# Patient Record
Sex: Female | Born: 1970 | Race: White | Hispanic: No | Marital: Married | Smoking: Former smoker
Health system: Southern US, Community
[De-identification: ages and names within clinical notes are randomized; demographics above are authoritative.]

## PROBLEM LIST (undated history)

## (undated) DIAGNOSIS — D6851 Activated protein C resistance: Secondary | ICD-10-CM

## (undated) DIAGNOSIS — K219 Gastro-esophageal reflux disease without esophagitis: Secondary | ICD-10-CM

## (undated) DIAGNOSIS — F329 Major depressive disorder, single episode, unspecified: Secondary | ICD-10-CM

## (undated) DIAGNOSIS — M797 Fibromyalgia: Secondary | ICD-10-CM

## (undated) DIAGNOSIS — I8390 Asymptomatic varicose veins of unspecified lower extremity: Secondary | ICD-10-CM

## (undated) DIAGNOSIS — Z8619 Personal history of other infectious and parasitic diseases: Secondary | ICD-10-CM

## (undated) DIAGNOSIS — G500 Trigeminal neuralgia: Secondary | ICD-10-CM

## (undated) DIAGNOSIS — F32A Depression, unspecified: Secondary | ICD-10-CM

## (undated) DIAGNOSIS — G501 Atypical facial pain: Secondary | ICD-10-CM

## (undated) HISTORY — DX: Activated protein C resistance: D68.51

## (undated) HISTORY — PX: APPENDECTOMY: SHX54

## (undated) HISTORY — DX: Atypical facial pain: G50.1

## (undated) HISTORY — DX: Gastro-esophageal reflux disease without esophagitis: K21.9

## (undated) HISTORY — DX: Asymptomatic varicose veins of unspecified lower extremity: I83.90

## (undated) HISTORY — DX: Fibromyalgia: M79.7

## (undated) HISTORY — DX: Major depressive disorder, single episode, unspecified: F32.9

## (undated) HISTORY — DX: Personal history of other infectious and parasitic diseases: Z86.19

## (undated) HISTORY — DX: Depression, unspecified: F32.A

---
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
## 1999-02-26 DIAGNOSIS — G501 Atypical facial pain: Secondary | ICD-10-CM

## 1999-02-26 HISTORY — DX: Atypical facial pain: G50.1

## 2005-04-25 ENCOUNTER — Encounter: Payer: Self-pay | Admitting: Family Medicine

## 2005-04-26 ENCOUNTER — Encounter: Payer: Self-pay | Admitting: Family Medicine

## 2005-04-26 LAB — CONVERTED CEMR LAB: TSH: 1.89 microintl units/mL

## 2006-02-03 ENCOUNTER — Telehealth (INDEPENDENT_AMBULATORY_CARE_PROVIDER_SITE_OTHER): Payer: Self-pay | Admitting: *Deleted

## 2006-02-19 ENCOUNTER — Ambulatory Visit: Payer: Self-pay | Admitting: Family Medicine

## 2006-02-19 DIAGNOSIS — K625 Hemorrhage of anus and rectum: Secondary | ICD-10-CM

## 2006-02-20 ENCOUNTER — Encounter: Payer: Self-pay | Admitting: Family Medicine

## 2006-03-18 ENCOUNTER — Other Ambulatory Visit: Admission: RE | Admit: 2006-03-18 | Discharge: 2006-03-18 | Payer: Self-pay | Admitting: Family Medicine

## 2006-03-18 ENCOUNTER — Ambulatory Visit: Payer: Self-pay | Admitting: Family Medicine

## 2006-03-18 ENCOUNTER — Encounter: Payer: Self-pay | Admitting: Family Medicine

## 2006-03-18 DIAGNOSIS — R920 Mammographic microcalcification found on diagnostic imaging of breast: Secondary | ICD-10-CM

## 2006-04-01 ENCOUNTER — Encounter: Payer: Self-pay | Admitting: Family Medicine

## 2006-06-18 ENCOUNTER — Ambulatory Visit: Payer: Self-pay | Admitting: Family Medicine

## 2006-06-19 ENCOUNTER — Telehealth (INDEPENDENT_AMBULATORY_CARE_PROVIDER_SITE_OTHER): Payer: Self-pay | Admitting: *Deleted

## 2006-06-19 ENCOUNTER — Encounter: Payer: Self-pay | Admitting: Family Medicine

## 2006-06-19 LAB — CONVERTED CEMR LAB
HCT: 41.7 % (ref 36.0–46.0)
Hemoglobin: 13.6 g/dL (ref 12.0–15.0)
MCV: 92.9 fL (ref 78.0–100.0)
RBC: 4.49 M/uL (ref 3.87–5.11)
WBC: 6.5 10*3/uL (ref 4.0–10.5)

## 2006-06-24 ENCOUNTER — Telehealth (INDEPENDENT_AMBULATORY_CARE_PROVIDER_SITE_OTHER): Payer: Self-pay | Admitting: *Deleted

## 2006-06-24 ENCOUNTER — Ambulatory Visit (HOSPITAL_COMMUNITY): Admission: RE | Admit: 2006-06-24 | Discharge: 2006-06-24 | Payer: Self-pay | Admitting: Family Medicine

## 2006-07-09 ENCOUNTER — Ambulatory Visit: Payer: Self-pay | Admitting: Family Medicine

## 2006-07-09 DIAGNOSIS — N943 Premenstrual tension syndrome: Secondary | ICD-10-CM | POA: Insufficient documentation

## 2006-08-26 ENCOUNTER — Ambulatory Visit: Payer: Self-pay | Admitting: Family Medicine

## 2006-08-26 LAB — CONVERTED CEMR LAB
Bilirubin Urine: NEGATIVE
Glucose, Urine, Semiquant: NEGATIVE
Ketones, urine, test strip: NEGATIVE
Nitrite: POSITIVE
Protein, U semiquant: NEGATIVE
Specific Gravity, Urine: 1.025
Urobilinogen, UA: NEGATIVE
pH: 5.5

## 2007-01-06 ENCOUNTER — Encounter: Admission: RE | Admit: 2007-01-06 | Discharge: 2007-01-06 | Payer: Self-pay | Admitting: Family Medicine

## 2007-01-06 ENCOUNTER — Ambulatory Visit: Payer: Self-pay | Admitting: Family Medicine

## 2007-01-06 LAB — CONVERTED CEMR LAB
Albumin: 4.5 g/dL (ref 3.5–5.2)
Alkaline Phosphatase: 29 units/L — ABNORMAL LOW (ref 39–117)
BUN: 13 mg/dL (ref 6–23)
Bilirubin Urine: NEGATIVE
CO2: 22 meq/L (ref 19–32)
Eosinophils Absolute: 0.1 10*3/uL — ABNORMAL LOW (ref 0.2–0.7)
Eosinophils Relative: 2 % (ref 0–5)
Glucose, Bld: 119 mg/dL — ABNORMAL HIGH (ref 70–99)
HCT: 44.2 % (ref 36.0–46.0)
Ketones, urine, test strip: NEGATIVE
Lipase: 33 units/L (ref 0–75)
Lymphocytes Relative: 32 % (ref 12–46)
Lymphs Abs: 2.1 10*3/uL (ref 0.7–4.0)
MCV: 90 fL (ref 78.0–100.0)
Monocytes Relative: 7 % (ref 3–12)
Neutrophils Relative %: 60 % (ref 43–77)
Potassium: 4 meq/L (ref 3.5–5.3)
Protein, U semiquant: NEGATIVE
RBC: 4.91 M/uL (ref 3.87–5.11)
Total Protein: 7.2 g/dL (ref 6.0–8.3)
Urobilinogen, UA: NEGATIVE
WBC: 6.5 10*3/uL (ref 4.0–10.5)
pH: 6

## 2007-01-07 ENCOUNTER — Encounter: Payer: Self-pay | Admitting: Family Medicine

## 2007-01-09 ENCOUNTER — Encounter: Admission: RE | Admit: 2007-01-09 | Discharge: 2007-01-09 | Payer: Self-pay | Admitting: Family Medicine

## 2007-01-29 ENCOUNTER — Ambulatory Visit: Payer: Self-pay | Admitting: Family Medicine

## 2007-03-30 ENCOUNTER — Encounter: Admission: RE | Admit: 2007-03-30 | Discharge: 2007-03-30 | Payer: Self-pay | Admitting: Family Medicine

## 2007-06-26 ENCOUNTER — Encounter: Payer: Self-pay | Admitting: Family Medicine

## 2007-07-07 ENCOUNTER — Ambulatory Visit: Payer: Self-pay | Admitting: Family Medicine

## 2007-07-07 DIAGNOSIS — G501 Atypical facial pain: Secondary | ICD-10-CM | POA: Insufficient documentation

## 2007-11-11 ENCOUNTER — Encounter: Payer: Self-pay | Admitting: Family Medicine

## 2007-12-11 ENCOUNTER — Ambulatory Visit: Payer: Self-pay | Admitting: Family Medicine

## 2007-12-14 LAB — CONVERTED CEMR LAB: HCV Ab: NEGATIVE

## 2007-12-15 ENCOUNTER — Telehealth: Payer: Self-pay | Admitting: Family Medicine

## 2007-12-30 ENCOUNTER — Ambulatory Visit: Payer: Self-pay | Admitting: Family Medicine

## 2007-12-30 ENCOUNTER — Encounter: Admission: RE | Admit: 2007-12-30 | Discharge: 2007-12-30 | Payer: Self-pay | Admitting: Family Medicine

## 2007-12-30 ENCOUNTER — Other Ambulatory Visit: Admission: RE | Admit: 2007-12-30 | Discharge: 2007-12-30 | Payer: Self-pay | Admitting: Family Medicine

## 2007-12-30 ENCOUNTER — Encounter: Payer: Self-pay | Admitting: Family Medicine

## 2007-12-30 DIAGNOSIS — R109 Unspecified abdominal pain: Secondary | ICD-10-CM

## 2007-12-30 DIAGNOSIS — N949 Unspecified condition associated with female genital organs and menstrual cycle: Secondary | ICD-10-CM | POA: Insufficient documentation

## 2007-12-30 LAB — CONVERTED CEMR LAB
ALT: 9 units/L (ref 0–35)
AST: 13 units/L (ref 0–37)
Albumin: 4.5 g/dL (ref 3.5–5.2)
Alkaline Phosphatase: 31 units/L — ABNORMAL LOW (ref 39–117)
Calcium: 9.7 mg/dL (ref 8.4–10.5)
Chloride: 108 meq/L (ref 96–112)
Glucose, Urine, Semiquant: NEGATIVE
HDL: 65 mg/dL (ref >39)
Hemoglobin: 14 g/dL (ref 12.0–15.0)
LDL Cholesterol: 132 mg/dL — ABNORMAL HIGH (ref 0–99)
MCHC: 33.5 g/dL (ref 30.0–36.0)
Nitrite: NEGATIVE
Platelets: 211 10*3/uL (ref 150–400)
Potassium: 4.3 meq/L (ref 3.5–5.3)
RDW: 13.4 % (ref 11.5–15.5)
Sodium: 141 meq/L (ref 135–145)
Specific Gravity, Urine: 1.02
TSH: 1.731 microintl units/mL (ref 0.350–4.50)
WBC Urine, dipstick: NEGATIVE
pH: 5

## 2007-12-31 ENCOUNTER — Encounter: Payer: Self-pay | Admitting: Family Medicine

## 2008-01-06 ENCOUNTER — Ambulatory Visit: Payer: Self-pay | Admitting: Obstetrics & Gynecology

## 2008-01-14 ENCOUNTER — Ambulatory Visit: Payer: Self-pay | Admitting: Obstetrics & Gynecology

## 2008-01-14 ENCOUNTER — Ambulatory Visit (HOSPITAL_COMMUNITY): Admission: RE | Admit: 2008-01-14 | Discharge: 2008-01-14 | Payer: Self-pay | Admitting: Obstetrics & Gynecology

## 2008-01-14 HISTORY — PX: HYSTEROSCOPY: SHX211

## 2008-01-14 HISTORY — PX: ENDOMETRIAL ABLATION: SHX621

## 2008-03-09 ENCOUNTER — Ambulatory Visit: Payer: Self-pay | Admitting: Obstetrics & Gynecology

## 2008-04-06 ENCOUNTER — Ambulatory Visit: Payer: Self-pay | Admitting: Obstetrics & Gynecology

## 2008-05-03 ENCOUNTER — Encounter: Admission: RE | Admit: 2008-05-03 | Discharge: 2008-05-03 | Payer: Self-pay | Admitting: Family Medicine

## 2008-06-22 ENCOUNTER — Ambulatory Visit: Payer: Self-pay | Admitting: Obstetrics & Gynecology

## 2008-08-18 ENCOUNTER — Ambulatory Visit: Payer: Self-pay | Admitting: Obstetrics & Gynecology

## 2008-09-14 ENCOUNTER — Ambulatory Visit: Payer: Self-pay | Admitting: Obstetrics & Gynecology

## 2008-09-15 ENCOUNTER — Encounter: Payer: Self-pay | Admitting: Obstetrics & Gynecology

## 2008-09-15 LAB — CONVERTED CEMR LAB: TSH: 1.993 microintl units/mL (ref 0.350–4.500)

## 2008-10-15 ENCOUNTER — Ambulatory Visit: Payer: Self-pay | Admitting: Family Medicine

## 2008-10-15 DIAGNOSIS — J0100 Acute maxillary sinusitis, unspecified: Secondary | ICD-10-CM

## 2008-11-16 ENCOUNTER — Ambulatory Visit: Payer: Self-pay | Admitting: Obstetrics & Gynecology

## 2009-01-24 ENCOUNTER — Encounter: Payer: Self-pay | Admitting: Family Medicine

## 2009-03-08 ENCOUNTER — Ambulatory Visit: Payer: Self-pay | Admitting: Obstetrics & Gynecology

## 2009-03-08 LAB — CONVERTED CEMR LAB
Hemoglobin: 14.2 g/dL (ref 12.0–15.0)
MCHC: 32.9 g/dL (ref 30.0–36.0)
RBC: 4.73 M/uL (ref 3.87–5.11)
RDW: 13.9 % (ref 11.5–15.5)

## 2009-03-22 ENCOUNTER — Ambulatory Visit: Payer: Self-pay | Admitting: Obstetrics & Gynecology

## 2009-03-27 ENCOUNTER — Encounter: Admission: RE | Admit: 2009-03-27 | Discharge: 2009-03-27 | Payer: Self-pay | Admitting: Obstetrics & Gynecology

## 2009-03-28 ENCOUNTER — Ambulatory Visit: Payer: Self-pay | Admitting: Obstetrics & Gynecology

## 2009-03-29 ENCOUNTER — Encounter: Payer: Self-pay | Admitting: Obstetrics & Gynecology

## 2009-03-29 LAB — CONVERTED CEMR LAB: CA 125: 14.4 units/mL (ref 0.0–30.2)

## 2009-05-03 ENCOUNTER — Encounter: Admission: RE | Admit: 2009-05-03 | Discharge: 2009-05-03 | Payer: Self-pay | Admitting: Obstetrics & Gynecology

## 2009-05-10 ENCOUNTER — Encounter: Payer: Self-pay | Admitting: Obstetrics & Gynecology

## 2009-05-10 ENCOUNTER — Ambulatory Visit (HOSPITAL_COMMUNITY): Admission: RE | Admit: 2009-05-10 | Discharge: 2009-05-11 | Payer: Self-pay | Admitting: Obstetrics & Gynecology

## 2009-05-10 ENCOUNTER — Ambulatory Visit: Payer: Self-pay | Admitting: Obstetrics & Gynecology

## 2009-05-10 HISTORY — PX: TOTAL ABDOMINAL HYSTERECTOMY: SHX209

## 2009-05-30 ENCOUNTER — Ambulatory Visit: Payer: Self-pay | Admitting: Family Medicine

## 2009-05-30 DIAGNOSIS — R599 Enlarged lymph nodes, unspecified: Secondary | ICD-10-CM | POA: Insufficient documentation

## 2009-05-30 LAB — CONVERTED CEMR LAB
Basophils Absolute: 0 10*3/uL (ref 0.0–0.1)
HCT: 37.7 % (ref 36.0–46.0)
Hemoglobin: 12.9 g/dL (ref 12.0–15.0)
Lymphocytes Relative: 30 % (ref 12–46)
Lymphs Abs: 1.5 10*3/uL (ref 0.7–4.0)
Neutro Abs: 3.1 10*3/uL (ref 1.7–7.7)
Platelets: 210 10*3/uL (ref 150–400)
RDW: 12.9 % (ref 11.5–15.5)
Rapid Strep: NEGATIVE
WBC: 4.9 10*3/uL (ref 4.0–10.5)

## 2009-05-31 ENCOUNTER — Encounter: Payer: Self-pay | Admitting: Family Medicine

## 2009-06-21 ENCOUNTER — Ambulatory Visit: Payer: Self-pay | Admitting: Obstetrics & Gynecology

## 2009-06-27 ENCOUNTER — Encounter: Admission: RE | Admit: 2009-06-27 | Discharge: 2009-06-27 | Payer: Self-pay | Admitting: Obstetrics & Gynecology

## 2009-06-27 ENCOUNTER — Ambulatory Visit: Payer: Self-pay | Admitting: Obstetrics & Gynecology

## 2009-07-19 ENCOUNTER — Ambulatory Visit: Payer: Self-pay | Admitting: Family Medicine

## 2009-07-19 LAB — CONVERTED CEMR LAB
Glucose, Urine, Semiquant: NEGATIVE
Nitrite: NEGATIVE
Protein, U semiquant: NEGATIVE
Urobilinogen, UA: 0.2

## 2009-07-20 ENCOUNTER — Encounter: Payer: Self-pay | Admitting: Family Medicine

## 2009-07-22 ENCOUNTER — Emergency Department (HOSPITAL_COMMUNITY): Admission: EM | Admit: 2009-07-22 | Discharge: 2009-07-22 | Payer: Self-pay | Admitting: Family Medicine

## 2009-07-22 ENCOUNTER — Emergency Department (HOSPITAL_COMMUNITY): Admission: EM | Admit: 2009-07-22 | Discharge: 2009-07-22 | Payer: Self-pay | Admitting: Emergency Medicine

## 2009-08-03 ENCOUNTER — Encounter: Payer: Self-pay | Admitting: Family Medicine

## 2009-08-30 ENCOUNTER — Encounter: Payer: Self-pay | Admitting: Obstetrics & Gynecology

## 2009-09-20 ENCOUNTER — Ambulatory Visit: Payer: Self-pay | Admitting: Obstetrics & Gynecology

## 2009-09-21 ENCOUNTER — Encounter: Payer: Self-pay | Admitting: Obstetrics & Gynecology

## 2009-09-21 LAB — CONVERTED CEMR LAB
Hemoglobin, Urine: NEGATIVE
Leukocytes, UA: NEGATIVE
Nitrite: NEGATIVE
Protein, ur: NEGATIVE mg/dL
Urobilinogen, UA: 0.2 (ref 0.0–1.0)
WBC, Wet Prep HPF POC: NONE SEEN

## 2009-09-26 ENCOUNTER — Encounter: Payer: Self-pay | Admitting: Obstetrics & Gynecology

## 2009-09-27 ENCOUNTER — Ambulatory Visit: Payer: Self-pay | Admitting: Obstetrics & Gynecology

## 2009-10-26 ENCOUNTER — Encounter: Payer: Self-pay | Admitting: Family Medicine

## 2009-11-01 ENCOUNTER — Ambulatory Visit: Payer: Self-pay | Admitting: Obstetrics & Gynecology

## 2009-11-02 ENCOUNTER — Encounter: Payer: Self-pay | Admitting: Obstetrics & Gynecology

## 2009-11-02 LAB — CONVERTED CEMR LAB
Clue Cells Wet Prep HPF POC: NONE SEEN
WBC, Wet Prep HPF POC: NONE SEEN

## 2009-11-23 ENCOUNTER — Ambulatory Visit: Payer: Self-pay | Admitting: Family Medicine

## 2009-11-23 DIAGNOSIS — N390 Urinary tract infection, site not specified: Secondary | ICD-10-CM

## 2009-11-23 LAB — CONVERTED CEMR LAB
Bilirubin Urine: NEGATIVE
Glucose, Urine, Semiquant: NEGATIVE
Ketones, urine, test strip: NEGATIVE
Specific Gravity, Urine: 1.02

## 2009-11-24 ENCOUNTER — Encounter: Payer: Self-pay | Admitting: Family Medicine

## 2009-11-28 ENCOUNTER — Encounter: Admission: RE | Admit: 2009-11-28 | Discharge: 2009-11-28 | Payer: Self-pay | Admitting: Family Medicine

## 2009-11-28 ENCOUNTER — Telehealth: Payer: Self-pay | Admitting: Family Medicine

## 2009-11-28 DIAGNOSIS — M545 Low back pain: Secondary | ICD-10-CM

## 2009-11-29 ENCOUNTER — Encounter: Payer: Self-pay | Admitting: Family Medicine

## 2009-11-29 ENCOUNTER — Ambulatory Visit: Payer: Self-pay | Admitting: Family Medicine

## 2010-03-02 ENCOUNTER — Encounter: Payer: Self-pay | Admitting: Family Medicine

## 2010-03-02 ENCOUNTER — Ambulatory Visit
Admission: RE | Admit: 2010-03-02 | Discharge: 2010-03-02 | Payer: Self-pay | Source: Home / Self Care | Attending: Family Medicine | Admitting: Family Medicine

## 2010-03-02 IMAGING — US US TRANSVAGINAL NON-OB
1 series · 13 of 25 positions shown · non-contrast
Comparison: The ultrasound 06/24/2006

CLINICAL DATA: 37-year-old female with menorrhagia

TRANSABDOMINAL AND TRANSVAGINAL ULTRASOUND OF PELVIS
TECHNIQUE: Both transabdominal and transvaginal ultrasound
examinations of the pelvis were performed including evaluation of
the uterus, ovaries, adnexal regions, and pelvic cul-de-sac.

[Series 1: us transvaginal non-ob · 0.30mm/px · 13 of 63 slices shown]
[im 1/63]
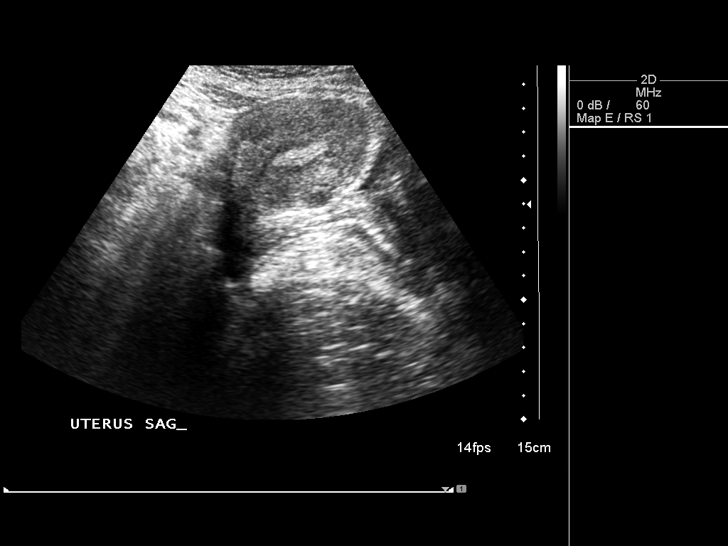
[im 6/63]
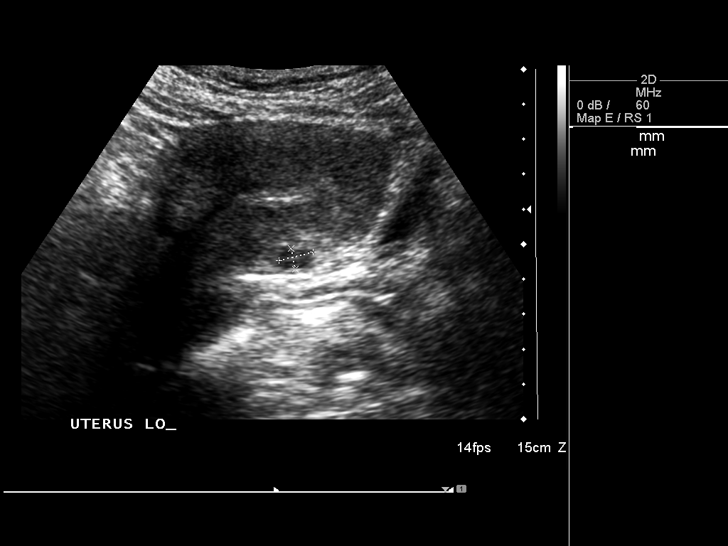
[im 11/63]
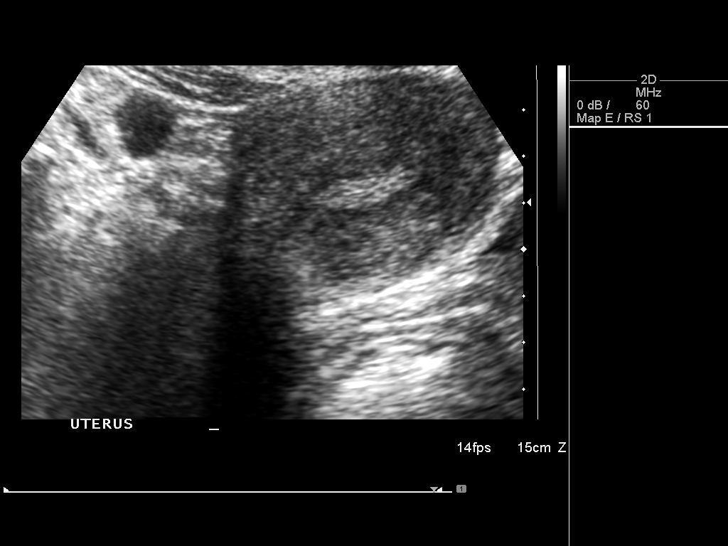
[im 16/63]
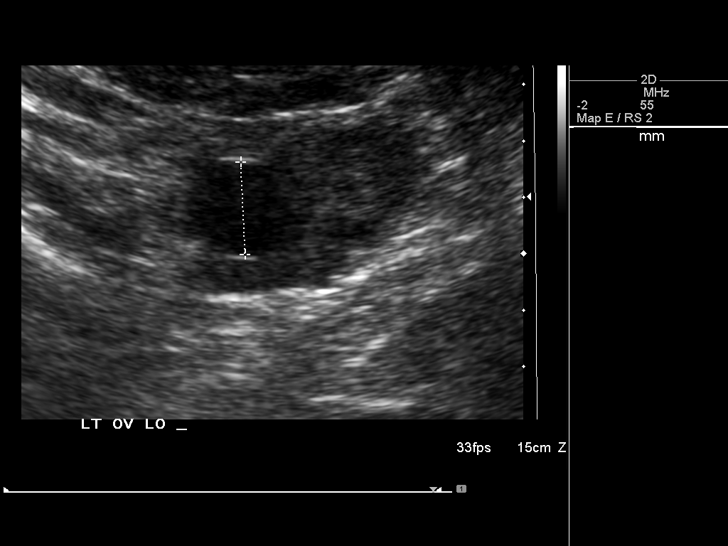
[im 21/63]
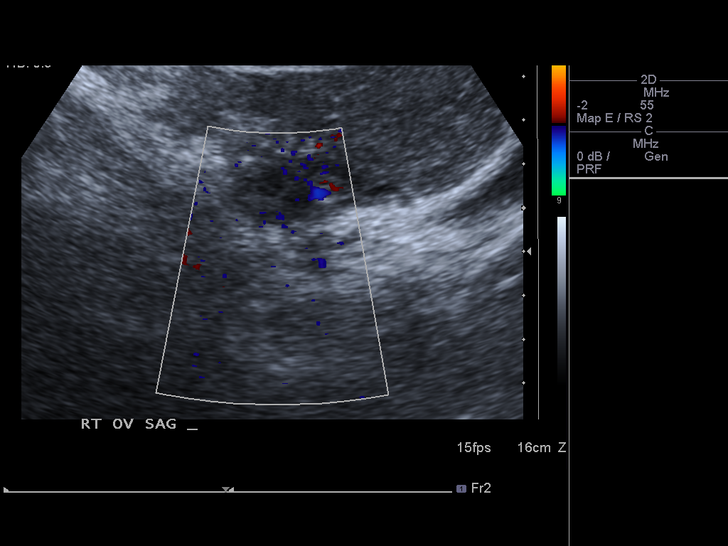
[im 26/63]
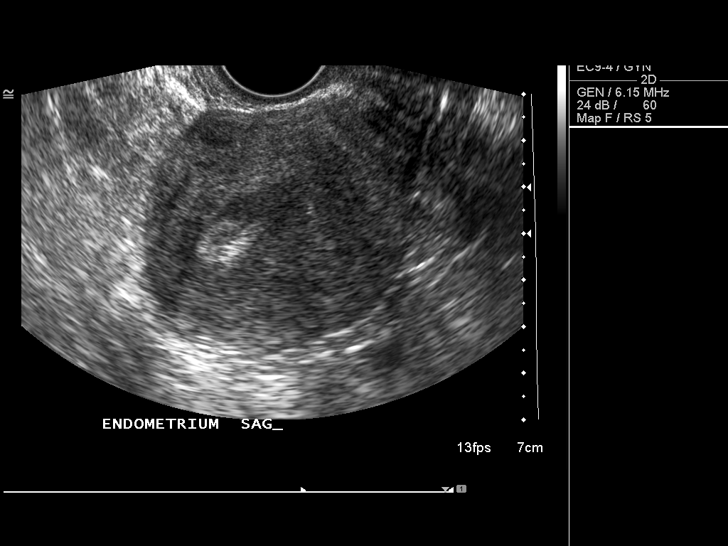
[im 32/63]
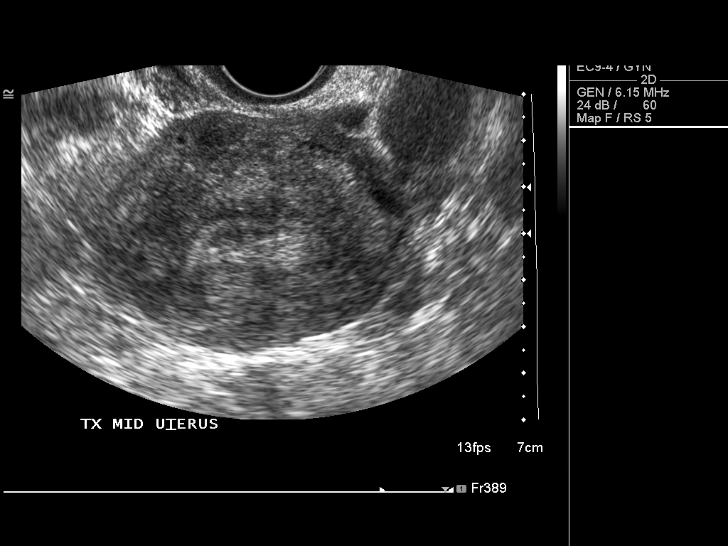
[im 37/63]
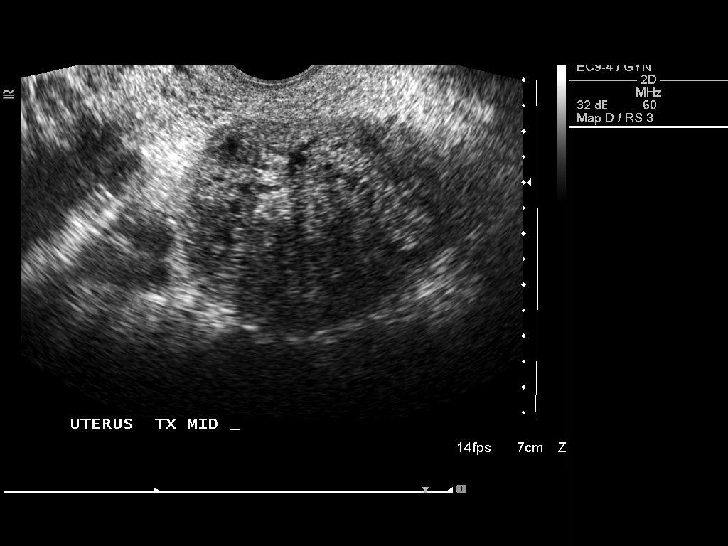
[im 42/63]
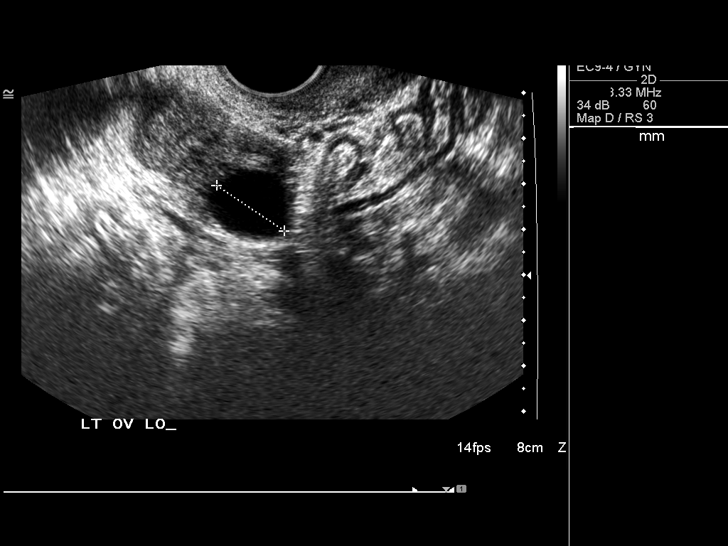
[im 47/63]
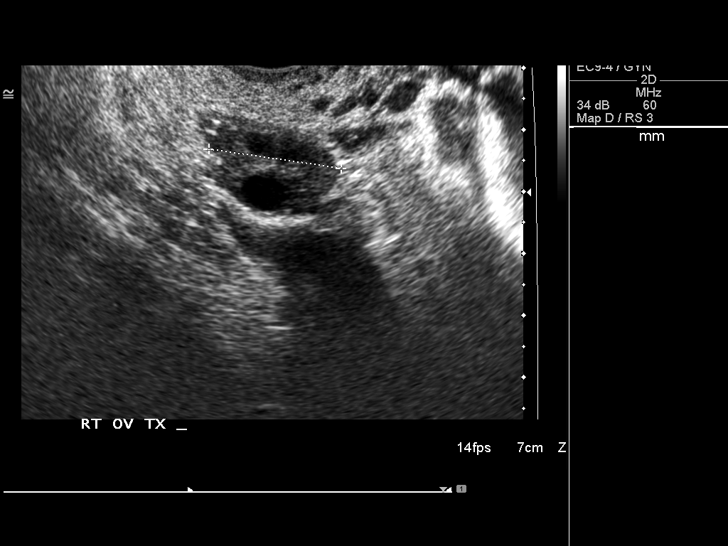
[im 52/63]
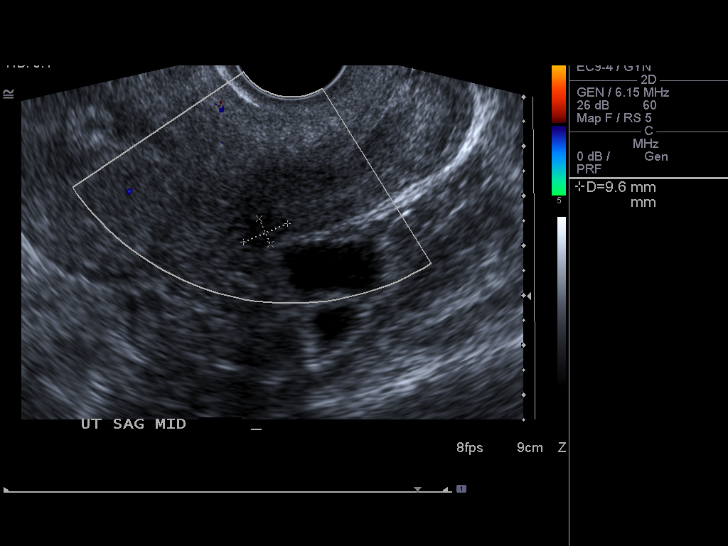
[im 57/63]
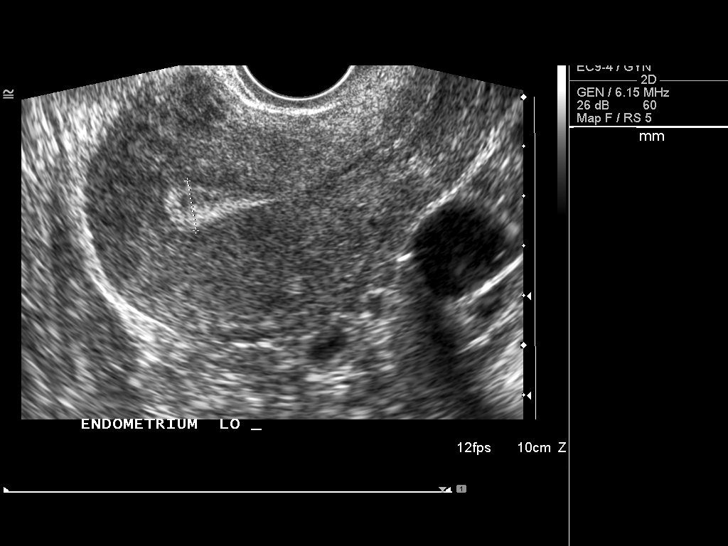
[im 63/63]
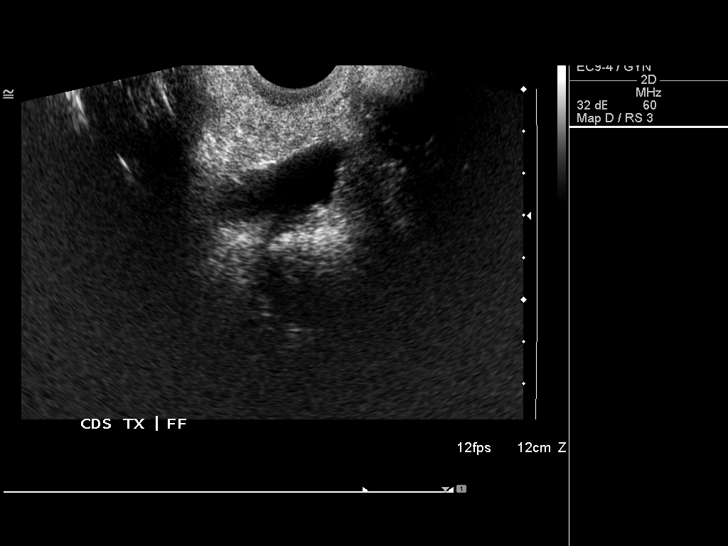

[13 of 25 positions shown; findings below may reference images not displayed]

FINDINGS: There the uterus is mildly heterogeneous in echo texture
and normal in size measuring 10.1 x 5.1 x 6.4 cm.  There are two
small round intramural lesions consistent with leiomyomas.  One in
the anterior uterine fundus measuring 1.1 cm and a second in the
posterior fundus measuring 1 cm.  There is a small 6 mm cystic
lesion in the lower uterine segment likely representing a high
nabothian cyst or cystic leiomyoma..

The endometrium is 11 mm in thickness which is normal for pre
menopausal female.

The right ovary measures 3.1 x 1.4 x 3.2 cm and is normal in
appearance.

The left ovary measures 4.4 x 2.1 x 2.4 cm and contains a 17 mm
dominant follicle.

Small volume of free fluid in the posterior cul-de-sac is likely
physiologic.
IMPRESSION: 1..  Small leiomyomas within the uterus.
2.  Normal endometrium for premenopausal female.
3.  Normal ovaries.

## 2010-03-14 ENCOUNTER — Ambulatory Visit (HOSPITAL_COMMUNITY)
Admission: RE | Admit: 2010-03-14 | Discharge: 2010-03-14 | Payer: Self-pay | Source: Home / Self Care | Attending: Psychology | Admitting: Psychology

## 2010-03-18 ENCOUNTER — Encounter: Payer: Self-pay | Admitting: Obstetrics & Gynecology

## 2010-03-27 NOTE — Letter (Signed)
Summary: [facility]  [facility]   Imported By: Lanelle Bal 12/08/2009 10:26:41  _____________________________________________________________________  External Attachment:    Type:   Image     Comment:   External Document

## 2010-03-27 NOTE — Assessment & Plan Note (Signed)
Summary: abdominal & Back pain,   Vital Signs:  Patient profile:   40 year old female Height:      66.5 inches Weight:      157 pounds Pulse rate:   119 / minute BP sitting:   117 / 76  (right arm) Cuff size:   regular  Vitals Entered By: Avon Gully CMA, Duncan Dull) (November 23, 2009 1:08 PM) CC: back pain and abd pain , has had 5 uti since hysterctomy   Primary Care Provider:  Seymour Bars D.O.  CC:  back pain and abd pain  and has had 5 uti since hysterctomy.  History of Present Illness: back pain and abd pain , has had 5 uti since hysterctomy. Still has her ovaries.  Dull pain on her right flank and occ radiates around to the RUQ. Did lab and a GB scan recently that were normal.  Says she knows she doesn't drink enough fluid during the da.  Had a bladder tack during her hyterectomy. Just had sxs for a few days.  No fever. No nausea or vomiting. No trauma to her back.  Current Medications (verified): 1)  Amoxicillin 500 Mg Tabs (Amoxicillin) .... Took For 2 Days Had Left Over From Previous Infection 2)  Valacyclovir Hcl 500 Mg Tabs (Valacyclovir Hcl) 3)  Pyridium 200 Mg Tabs (Phenazopyridine Hcl) .... Sig 1 By Mouth 3x A Day As Needed For Symptoms 4)  Bactrim Ds 800-160 Mg Tabs (Sulfamethoxazole-Trimethoprim) .... Take 1 Tablet By Mouth Two Times A Day For 7 Days 5)  Baclofen 10 Mg Tabs (Baclofen) .... One Tablet By Mouth Once A Day 6)  Oxycontin 10 Mg Xr12h-Tab (Oxycodone Hcl) .Marland Kitchen.. 1-2 Tablet By Mouth As Needed Every Six Hours For Pain  Allergies (verified): 1)  ! Hydrocodone  Comments:  Nurse/Medical Assistant: The patient's medications and allergies were reviewed with the patient and were updated in the Medication and Allergy Lists. Avon Gully CMA, Duncan Dull) (November 23, 2009 1:24 PM)  Physical Exam  General:  Well-developed,well-nourished,in no acute distress; alert,appropriate and cooperative throughout examination Abdomen:  soft, normal bowel sounds, no  distention, no hepatomegaly, and no splenomegaly.  Mildly tender in the rUQ and right flank below the ribs.  Msk:  +CVA tenderness on the right.    Impression & Recommendations:  Problem # 1:  UTI (ICD-599.0) UA is + will tx and send for cx.  Likely source of her pain since recentl labs and GB scan were normal. If cx neg then rec further evaluation.  Says she has been on ABX for years and is usually very resistant so is requesting a longer course of ABX.  Her updated medication list for this problem includes:    Amoxicillin 500 Mg Tabs (Amoxicillin) .Marland Kitchen... Took for 2 days had left over from previous infection    Bactrim Ds 800-160 Mg Tabs (Sulfamethoxazole-trimethoprim) .Marland Kitchen... Take 1 tablet by mouth two times a day for 7 days  Orders: UA Dipstick w/o Micro (automated)  (81003) T-Urine Culture (Spectrum Order) [id number])  Complete Medication List: 1)  Amoxicillin 500 Mg Tabs (Amoxicillin) .... Took for 2 days had left over from previous infection 2)  Valacyclovir Hcl 500 Mg Tabs (Valacyclovir hcl) 3)  Pyridium 200 Mg Tabs (phenazopyridine Hcl)  .... Sig 1 by mouth 3x a day as needed for symptoms 4)  Bactrim Ds 800-160 Mg Tabs (Sulfamethoxazole-trimethoprim) .... Take 1 tablet by mouth two times a day for 7 days 5)  Baclofen 10 Mg Tabs (Baclofen) .... One tablet by  mouth once a day 6)  Oxycontin 10 Mg Xr12h-tab (oxycodone Hcl)  .Marland Kitchen.. 1-2 tablet by mouth as needed every six hours for pain  Patient Instructions: 1)  Complete the antibiotic and we will call with the cultue results.  Prescriptions: BACTRIM DS 800-160 MG TABS (SULFAMETHOXAZOLE-TRIMETHOPRIM) Take 1 tablet by mouth two times a day for 7 days  #14 x 0   Entered and Authorized by:   Nani Gasser MD   Signed by:   Nani Gasser MD on 11/23/2009   Method used:   Electronically to        [facility] [id number]       [address]       [place], [place]  [address]       Ph: [phone]       Fax:  [phone]   RxID:   [id number]   Laboratory Results   Urine Tests  Date/Time Received: 11/23/09 Date/Time Reported: 11/23/09  Routine Urinalysis   Color: yellow Appearance: Clear Glucose: negative   (Normal Range: Negative) Bilirubin: negative   (Normal Range: Negative) Ketone: negative   (Normal Range: Negative) Spec. Gravity: 1.020   (Normal Range: 1.003-1.035) Blood: small   (Normal Range: Negative) pH: 5.5   (Normal Range: 5.0-8.0) Protein: negative   (Normal Range: Negative) Urobilinogen: 0.2   (Normal Range: 0-1) Nitrite: negative   (Normal Range: Negative) Leukocyte Esterace: trace   (Normal Range: Negative)

## 2010-03-27 NOTE — Progress Notes (Signed)
Summary: Continued with back pain  Phone Note Call from Patient   Caller: Patient Call For: Seymour Bars DO Summary of Call: Pt is still having back pain has 2 days left of med and didn't know if needed different med or not. States doesn't feel well and back bad really hurts. Initial call taken by: Kathlene November LPN,  November 28, 2009 12:51 PM  Follow-up for Phone Call        Can she do a clean catch UA today and a L spine XRay downstairs today then come in to see me for OV tomorrow? Follow-up by: Seymour Bars DO,  November 28, 2009 1:05 PM  Additional Follow-up for Phone Call Additional follow up Details #1::        Pt notified.Need xray order Additional Follow-up by: Kathlene November LPN,  November 28, 2009 1:09 PM  New Problems: LUMBAGO (ICD-724.2)   Additional Follow-up for Phone Call Additional follow up Details #2::    done. Follow-up by: Seymour Bars DO,  November 28, 2009 1:11 PM  New Problems: LUMBAGO (ICD-724.2)

## 2010-03-27 NOTE — Assessment & Plan Note (Signed)
Summary: POSSIBLE UTI/BLADDER INFECTION   Vital Signs:  Patient Profile:   40 Years Old Female CC:      Dysuria, diarrhea x 4 days Height:     66.5 inches Weight:      150 pounds O2 Sat:      98 % O2 treatment:    Room Air Temp:     98.3 degrees F oral Pulse rate:   91 / minute Pulse rhythm:   regular Resp:     12 per minute BP sitting:   123 / 81  (right arm) Cuff size:   regular  Vitals Entered By: Emilio Math (Jul 19, 2009 9:36 AM)                  Prior Medication List:  OXYCODONE HCL 15 MG TABS (OXYCODONE HCL) four times a day VALTREX 1 GM TABS (VALACYCLOVIR HCL) One by mouth q8h   Current Allergies (reviewed today): ! HYDROCODONEHistory of Present Illness Referral source: Patient  Chief Complaint: Dysuria, diarrhea x 4 days History of Present Illness: Patient report having urinary symptoms about 3 weeks ago> she took Amoxicillin for 3 days. She did get better but then 4 days ago she started having symptoms. she has had a bladder tack in the past.  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  Eyes       Denies  change in vision, eye pain, eye discharge, glasses, contact lenses, and eye surgery. Ear/Nose/Throat/Mouth       Denies hearing loss/aids, change in hearing, ear pain, ear discharge, dizziness, frequent runny nose, frequent nose bleeds, sinus problems, sore throat, hoarseness, and tooth pain or bleeding.  Respiratory       Denies dry cough, productive cough, wheezing, shortness of breath, asthma, bronchitis, and emphysema/COPD.  Cardiovascular       Denies murmurs, chest pain, and tires easily with exhertion.    Gastrointestinal       Complains of stomach pain and diarrhea.      Denies nausea/vomiting, constipation, blood in bowel movements, and indigestion. Genitourniary       Complains of painful urination.      Denies kidney stones and loss of urinary control.      Comments: bladder tack Neurological       Denies paralysis, seizures, and fainting/blackouts. Musculoskeletal       Denies muscle pain, joint pain, joint stiffness, decreased range of motion, redness, swelling, muscle weakness, and gout.  Skin       Denies bruising, unusual mles/lumps or sores, and hair/skin or nail changes.  Psych       Denies mood changes, temper/anger issues, anxiety/stress, speech problems, depression, and sleep problems.  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    Has 2 kids.   Married   Moved from [place] 2006.    Quit smoking in 2001.  Runs  4 days a wk. Alcohol use-no Drug use-no  Risk Factors: Exercise: yes (03/18/2006)  Risk Factors: Smoking Status: quit (02/19/2006)  Past Medical History: Reviewed history from 07/09/2006 and no changes required. trigeminal neuralgia (Dr Jordan Likes) Factor V Leiden deficiency TRAIT  Past Surgical History: Hysterectomy 04/2009 Bladder lift March 2011  Family History: Reviewed  history from 12/30/2007 and no changes required. mother diverticulosis father healthy brother IBD grandmother CVA's  Social History: Reviewed history from 05/30/2009 and no changes required. 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    Has 2 kids.   Married   Moved from [place] 2006.    Quit smoking in 2001.  Runs  4 days a wk. Alcohol use-no Drug use-no Physical Exam General appearance: well developed, well nourished, mild  distress Head: normocephalic, atraumatic Extremities: normal extremities Back: no CVA tendernesss   mild bladder tender  Skin: no obvious rashes or lesions MSE: oriented to time, place, and person Assessment New Problems: URINARY FREQUENCY (ICD-788.41) URINARY TRACT INFECTION (ICD-599.0)  UTI   Patient Education: Patient and/or caregiver instructed in the following: rest fluids and Tylenol.  Plan New Medications/Changes: PYRIDIUM 200 MG TABS (PHENAZOPYRIDINE HCL) Sig 1 by mouth 3x a day as needed for symptoms  #15 x 0, 07/19/2009, Hassan Rowan MD CIPRO 500 MG TABS (CIPROFLOXACIN HCL) 1 by mouth 2 times daily  #10 x 0, 07/19/2009, Hassan Rowan MD  New Orders: T-Culture, Urine [[id number]] Est. Patient Level III [44010] UA Dipstick W/ Micro (manual) [81000] Planning Comments:   see below  Follow Up: Follow up in 2-3 days if no improvement, Follow up on an as needed basis, Follow up with Primary Physician  The patient and/or caregiver has been counseled thoroughly with regard to medications prescribed including dosage, schedule, interactions, rationale for use, and possible side effects and they verbalize understanding.  Diagnoses and expected course of recovery discussed and will return if not improved as expected or if the condition worsens. Patient and/or caregiver verbalized understanding.  Prescriptions: PYRIDIUM 200 MG TABS (PHENAZOPYRIDINE HCL) Sig 1 by mouth 3x a day as needed for symptoms  #15 x 0   Entered and Authorized by:   Hassan Rowan  MD   Signed by:   Hassan Rowan MD on 07/19/2009   Method used:   Print[facility] ...       [facility] [id number]       [address]       [place], [place]  [address]       Ph: [phone]       Fax: [phone]   RxID:   [id number] CIPRO 500 MG TABS (CIPROFLOXACIN HCL) 1 by mouth 2 times daily  #10 x 0   Entered and Authorized by:   Hassan Rowan MD   Signed by:   Hassan Rowan MD on 07/19/2009   Method used:   Printed then faxed [facility] ...       [facility] [address]       [address]       [place], [place]  [address]       Ph: [phone]       Fax: [phone]   RxID:   [id number]   Patient Instructions: 1)  Please schedule an appointment with your primary doctor in : 2)  a follow-up appointment in 2 weeks for POC 3)  Take your antibiotic as prescribed until ALL of it is gone, but stop if you develop a rash or swelling and contact our office as soon as possible.  Laboratory Results   Urine Tests  Date/Time Received: Jul 19, 2009 9:49 AM  Date/Time Reported: Jul 19, 2009 9:49 AM   Routine Urinalysis   Color: yellow Appearance: slightly Cloudy Glucose: negative   (Normal Range: Negative) Bilirubin: negative   (Normal Range: Negative) Ketone: trace (5)   (Normal Range: Negative) Spec. Gravity: 1.020   (Normal Range: 1.003-1.035) Blood: negative   (Normal Range: Negative) pH: 5.5   (Normal Range: 5.0-8.0) Protein: negative   (Normal Range: Negative) Urobilinogen: 0.2   (Normal Range: 0-1) Nitrite: negative   (Normal Range: Negative) Leukocyte Esterace: trace   (Normal Range: Negative)

## 2010-03-27 NOTE — Assessment & Plan Note (Signed)
Summary: Possible virus/body aches/pain rightside of face   Vital Signs:  Patient Profile:   40 Years Old Female CC:      Right side facial pain, chills, night sweats, HAs, body aches X 4 days Height:     66.5 inches Weight:      151 pounds O2 Sat:      97 % O2 treatment:    Room Air Temp:     98.5 degrees F oral Pulse rate:   90 / minute Resp:     16 per minute BP sitting:   121 / 84  (right arm) Cuff size:   regular  Pt. in pain?   yes    Location:   Right face    Intensity:   8    Type:       aching  Vitals Entered By: Lajean Saver RN (May 30, 2009 1:07 PM)                   Updated Prior Medication List: OXYCODONE HCL 15 MG TABS (OXYCODONE HCL) four times a day  Current Allergies (reviewed today): ! HYDROCODONEHistory of Present Illness Chief Complaint: Right side facial pain, chills, night sweats, HAs, body aches X 4 days History of Present Illness: Subjective:  Patient complains of onset of myalgias about 4 to 5 days ago followed by small blisters in her right mouth on the buccal mucosae and gingiva, now resolved, although her mouth is still sore on the right.  She has noticed soreness in her right anterior/posterior cervical nodes but does not have a sore throat.  She has a history of right sided trigeminal neuralgia, but now has right facial pain of a different nature.  She denies facial rash.  No URI symptoms.  No fevers, chills, and sweats   REVIEW OF SYSTEMS Constitutional Symptoms       Complains of chills and night sweats.     Denies fever, weight loss, weight gain, and fatigue.      Comments: body aches Eyes       Denies change in vision, eye pain, eye discharge, glasses, contact lenses, and eye surgery. Ear/Nose/Throat/Mouth       Denies hearing loss/aids, change in hearing, ear pain, ear discharge, dizziness, frequent runny nose, frequent nose bleeds, sinus problems, sore throat, hoarseness, and tooth pain or bleeding.  Respiratory       Denies dry  cough, productive cough, wheezing, shortness of breath, asthma, bronchitis, and emphysema/COPD.  Cardiovascular       Denies murmurs, chest pain, and tires easily with exhertion.    Gastrointestinal       Denies stomach pain, nausea/vomiting, diarrhea, constipation, blood in bowel movements, and indigestion. Genitourniary       Denies painful urination, kidney stones, and loss of urinary control. Neurological       Complains of headaches.      Denies paralysis, seizures, and fainting/blackouts. Musculoskeletal       Denies muscle pain, joint pain, joint stiffness, decreased range of motion, redness, swelling, muscle weakness, and gout.      Comments: right facial pain Skin       Denies bruising, unusual mles/lumps or sores, and hair/skin or nail changes.  Psych       Denies mood changes, temper/anger issues, anxiety/stress, speech problems, depression, and sleep problems. Other Comments: Blisters in mouth 3 days ago, used magic mouthwash-resloved   Past History:  Past Surgical History: Hysterectomy 04/2009  Family History: Reviewed history from 12/30/2007  and no changes required. 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    Has 2 kids.   Married   Moved from [place] 2006.    Quit smoking in 2001.  Runs  4 days a wk. Alcohol use-no Drug use-no Drug Use:  no   Objective:  No acute distress  Eyes:  Pupils are equal, round, and reactive to light and accomdation.  Extraocular movement is intact.  Conjunctivae are not inflamed.  Ears:  Left canal and tympanic membrane normal.  Right external auditory canal occluded with cerumen. Nose:  Normal septum.  Normal turbinates, mildly congested.   No sinus tenderness present.  Mouth:  No lesions present.  There is mild tenderness to palpation of the right buccal mucosae and gingiva Pharynx:  Mildly erythematous on the right. Neck:  Supple.  Slightly  tender shotty anterior/posterior nodes are palpated on the right. Rapid strep test negative  CBC:  WBC 4.9, Hgb 12.9 Assessment New Problems: CERVICAL LYMPHADENOPATHY (ICD-785.6)  SUSPECT EARLY SHINGLES RATHER THAN FLARE-UP OF TRIGEMINAL NEURALGIA.  Atypical hand, foot, and mouth disease is another possibility.  Plan New Medications/Changes: VALTREX 1 GM TABS (VALACYCLOVIR HCL) One by mouth q8h  #21 x 0, 05/30/2009, Donna Christen MD  New Orders: T-CBC w/Diff [[id number]] Rapid Strep [87880] T-Culture, Throat [[id number]] Est. Patient Level III [29562] Planning Comments:   Empirically begin Valtrex.  Throat culture pending.  Follow-up with PCP if not improving   The patient and/or caregiver has been counseled thoroughly with regard to medications prescribed including dosage, schedule, interactions, rationale for use, and possible side effects and they verbalize understanding.  Diagnoses and expected course of recovery discussed and will return if not improved as expected or if the condition worsens. Patient and/or caregiver verbalized understanding.  Prescriptions: VALTREX 1 GM TABS (VALACYCLOVIR HCL) One by mouth q8h  #21 x 0   Entered and Authorized by:   Donna Christen MD   Signed by:   Donna Christen MD on 05/30/2009   Method used:   Print then Give to Patient   RxID:   [id number]   Laboratory Results  Date/Time Received: May 30, 2009 2:18 PM  Date/Time Reported: May 30, 2009 2:18 PM   Other Tests  Rapid Strep: negative  Kit Test Internal QC: Negative   (Normal Range: Negative)

## 2010-03-27 NOTE — Assessment & Plan Note (Signed)
Summary: back pain   Vital Signs:  Patient profile:   40 year old female Height:      66.5 inches Weight:      160 pounds BMI:     25.53 O2 Sat:      97 % on Room air Temp:     98.3 degrees F oral Pulse rate:   81 / minute BP sitting:   119 / 76  (left arm) Cuff size:   regular  Vitals Entered By: Payton Spark CMA (November 29, 2009 1:58 PM)  O2 Flow:  Room air CC: F/U back pain and xray.   Primary Care Provider:  Seymour Bars D.O.  CC:  F/U back pain and xray.Marland Kitchen  History of Present Illness: 40 yo WF presents for back pain and f/u for trigemeninal neuralgia.    She has been seeing the neurologist for her worsening pain.  She has several nerve blocks but nothing helped.  She is needing to take Percocet.    She had some DDD at L5-S1 on Xray but her pain is over the R thoracic region.  She has been busy trying to clean her house since they are moving.  She is on day 6 of 7 of Bactrim DS for a presumed UTI but her urine cx was inconclusive.    Current Medications (verified): 1)  Bactrim Ds 800-160 Mg Tabs (Sulfamethoxazole-Trimethoprim) .... Take 1 Tablet By Mouth Two Times A Day For 7 Days 2)  Baclofen 10 Mg Tabs (Baclofen) .... One Tablet By Mouth Once A Day 3)  Oxycontin 10 Mg Xr12h-Tab (Oxycodone Hcl) .Marland Kitchen.. 1-2 Tablet By Mouth As Needed Every Six Hours For Pain  Allergies (verified): 1)  ! Hydrocodone  Past History:  Past Medical History: Reviewed history from 07/09/2006 and no changes required. trigeminal neuralgia (Dr Jordan Likes) Factor V Leiden deficiency TRAIT  Past Surgical History: Reviewed history from 07/19/2009 and no changes required. Hysterectomy 04/2009 Bladder lift March 2011  Family History: Reviewed history from 12/30/2007 and no changes required. mother diverticulosis father healthy brother IBD grandmother CVA's  Social History: Reviewed history from 05/30/2009 and no changes required. 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    Has 2 kids.     Married   Moved from [place] 2006.    Quit smoking in 2001.  Runs  4 days a wk. Alcohol use-no Drug use-no  Review of Systems      See HPI  Physical Exam  General:  alert, well-developed, well-nourished, and well-hydrated.   Head:  normocephalic and atraumatic.   Lungs:  Normal respiratory effort, chest expands symmetrically. Lungs are clear to auscultation, no crackles or wheezes. Heart:  Normal rate and regular rhythm. S1 and S2 normal without gallop, murmur, click, rub or other extra sounds. Msk:  full L spine flexion/ ext and T spine rotation and SB. tenderness with muscle spasm over the R paraspinal/ rhomboids.  Extremities:  no LE edema Neurologic:  gait normal.   Skin:  color normal.   Cervical Nodes:  No lymphadenopathy noted Psych:  good eye contact, not anxious appearing, and not depressed appearing.     Impression & Recommendations:  Problem # 1:  FLANK PAIN, RIGHT (ICD-789.09) Likely musclar and not UTI related. She has a repeat urine Cx pending to check tomorrow. Treat with heating pad, advil.   Her updated medication list for this problem includes:    Baclofen 10 Mg Tabs (Baclofen) ..... One tablet by mouth once a day    Percocet  5-325 Mg Tabs (Oxycodone-acetaminophen) .Marland Kitchen... Take as needed for trigeminal neuralgia  Problem # 2:  LUMBAGO (ICD-724.2) L5-S1 DDD on xray but she is really not having pain here.  She has been given the green light to resume running. Her updated medication list for this problem includes:    Baclofen 10 Mg Tabs (Baclofen) ..... One tablet by mouth once a day    Percocet 5-325 Mg Tabs (Oxycodone-acetaminophen) .Marland Kitchen... Take as needed for trigeminal neuralgia  Problem # 3:  TRIGEMINAL NEURALGIA (ICD-350.1) Per neuro.  Complete Medication List: 1)  Bactrim Ds 800-160 Mg Tabs (Sulfamethoxazole-trimethoprim) .... Take 1 tablet by mouth two times a day for 7 days 2)  Baclofen 10 Mg Tabs (Baclofen) .... One tablet by mouth once a day 3)   Percocet 5-325 Mg Tabs (Oxycodone-acetaminophen) .... Take as needed for trigeminal neuralgia  Patient Instructions: 1)  You have degenerative disc dz at L5-S1. 2)  Pain in the R flank is not from a UTI.  This is muscloskeletal. 3)  Treat with Advil 3-4 tabs 3 x a day with food as needed. 4)  Use a heating pad and gentle stretching. 5)  Consider chiropractive care for chronic back pain. 6)  You are OK to resume running. 7)  Best of luck with your move!

## 2010-03-27 NOTE — Letter (Signed)
Summary: [facility]  [facility]   Imported By: Lanelle Bal 12/08/2009 10:27:29  _____________________________________________________________________  External Attachment:    Type:   Image     Comment:   External Document

## 2010-03-29 NOTE — Letter (Signed)
Summary: Depression Questionnaire  Depression Questionnaire   Imported By: Lanelle Bal 03/16/2010 13:49:55  _____________________________________________________________________  External Attachment:    Type:   Image     Comment:   External Document

## 2010-03-29 NOTE — Assessment & Plan Note (Signed)
Summary: Depression   Vital Signs:  Patient profile:   40 year old female Height:      66.5 inches Weight:      164 pounds BMI:     26.17 O2 Sat:      97 % on Room air Pulse rate:   81 / minute BP sitting:   135 / 79  (left arm) Cuff size:   regular  Vitals Entered By: Payton Spark CMA (March 02, 2010 1:27 PM)  O2 Flow:  Room air CC: ? Depression.   Primary Care Provider:  Seymour Bars D.O.  CC:  ? Depression.Marland Kitchen  History of Present Illness: 40 yo WF presents for depression.  She is moving to [facility] and her husband is already there.  She is having pain with her trigeminal neuralgia.  She has been on anti depressants in the past.  Had severe SEs on Cymbalta a few years ago and did not do well on wellbutrin in the past.  She woiuld be willing to retry Fluoxetine which she took int he past.  Her family is supportive but she feels overhwelmed, irritable and very tearful.  Denies suicidial ideations.  Current Medications (verified): 1)  Baclofen 10 Mg Tabs (Baclofen) .... One Tablet By Mouth Once A Day 2)  Percocet 5-325 Mg Tabs (Oxycodone-Acetaminophen) .... Take As Needed For Trigeminal Neuralgia  Allergies (verified): 1)  ! Hydrocodone  Past History:  Past Medical History: Reviewed history from 07/09/2006 and no changes required. trigeminal neuralgia (Dr Jordan Likes) Factor V Leiden deficiency TRAIT  Social History: Reviewed history from 05/30/2009 and no changes required. 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    Has 2 kids.   Married   Moved from [place] 2006.    Quit smoking in 2001.  Runs  4 days a wk. Alcohol use-no Drug use-no  Review of Systems Psych:  Complains of anxiety, depression, easily angered, easily tearful, and irritability; denies panic attacks, sense of great danger, suicidal thoughts/plans, and thoughts of violence.  Physical Exam  General:  alert, well-developed, well-nourished, and well-hydrated.   Head:  normocephalic and atraumatic.   Psych:   good eye contact, depressed affect, and tearful.     Impression & Recommendations:  Problem # 1:  DEPRESSION, RECURRENT (ICD-311) Assessment Deteriorated Likely due to stress of upcoming move, husband's abscence, lack of exercise, chronic pain. She is willing to retry Fluoxetine.  We discussed stress reduction and getting back to exercise.  Already on pain meds thru neuro for her T.N. Added short term as needed Benzo for now but will call me if any problems.  Added counseling. Her updated medication list for this problem includes:    Fluoxetine Hcl 20 Mg Tabs (Fluoxetine hcl) .Marland Kitchen... 1/2 tab by mouth qpm with dinner x  wk then increase to 1 tab by mouth q pm    Alprazolam 0.5 Mg Tabs (Alprazolam) .Marland Kitchen... 1/2 to 1 tab by mouth two times a day as needed anxiety  Orders: Psychology Referral (Psychology)  Complete Medication List: 1)  Baclofen 10 Mg Tabs (Baclofen) .... One tablet by mouth once a day 2)  Percocet 5-325 Mg Tabs (Oxycodone-acetaminophen) .... Take as needed for trigeminal neuralgia 3)  Tegretol 100 Mg Chew (Carbamazepine) .Marland Kitchen.. 1 tab by mouth qhs 4)  Fluoxetine Hcl 20 Mg Tabs (Fluoxetine hcl) .... 1/2 tab by mouth qpm with dinner x  wk then increase to 1 tab by mouth q pm 5)  Alprazolam 0.5 Mg Tabs (Alprazolam) .... 1/2 to 1  tab by mouth two times a day as needed anxiety  Patient Instructions: 1)  for depression: 2)  Take Fluoxetine 1/2 tab by mouth once daily with dinner x 1 week then increase to 1 tab by mouth daily with dinner. 3)  Use Alprazolam 1/2 to 1 tab up to 2 x a day as needed for mood now (it will make you sleepy). 4)  Avoid alcohol.  Try to get some exercise. 5)  Counseling referral made for downstairs. 6)  Call me if any problems including worsening of mood. 7)  Follow up in 4 weeks. Prescriptions: FLUOXETINE HCL 20 MG TABS (FLUOXETINE HCL) 1/2 tab by mouth qPM with dinner x  wk then increase to 1 tab by mouth q PM  #30 x 2   Entered and Authorized by:   Seymour Bars DO   Signed by:   Seymour Bars DO on 03/02/2010   Method used:   Electronically to        [facility]* (retail)       [address] [place], [place]  [address]       Ph: [phone]       Fax: [phone]   RxID:   [id number] ALPRAZOLAM 0.5 MG TABS (ALPRAZOLAM) 1/2 to 1 tab by mouth two times a day as needed anxiety  #60 x 1   Entered and Authorized by:   Seymour Bars DO   Signed by:   Seymour Bars DO on 03/02/2010   Method used:   Printed then faxed to ...       [facility] [address] (retail)       [address]       [place], [place]  [address]       Ph: [phone]       Fax: [phone]   RxID:   [id number] FLUOXETINE HCL 20 MG TABS (FLUOXETINE HCL) 1/2 tab by mouth qPM with dinner x  wk then increase to 1 tab by mouth q PM  #30 x 2   Entered and Authorized by:   Seymour Bars DO   Signed by:   Seymour Bars DO on 03/02/2010   Method used:   Electronically to        [id number] (retail)       [address]       [place], [place]  [address]       Ph: [phone]       Fax: [phone]   RxID:   [id number]    Orders Added: 1)  Psychology Referral [Psychology] 2)  Est. Patient Level III [[address]]

## 2010-04-20 ENCOUNTER — Encounter: Payer: Self-pay | Admitting: Family Medicine

## 2010-04-25 ENCOUNTER — Telehealth (INDEPENDENT_AMBULATORY_CARE_PROVIDER_SITE_OTHER): Payer: Self-pay | Admitting: *Deleted

## 2010-05-03 ENCOUNTER — Encounter: Payer: Self-pay | Admitting: Obstetrics & Gynecology

## 2010-05-14 LAB — APTT: aPTT: 27 seconds (ref 24–37)

## 2010-05-14 LAB — CBC
HCT: 40.2 % (ref 36.0–46.0)
Hemoglobin: 13.8 g/dL (ref 12.0–15.0)
MCHC: 34.3 g/dL (ref 30.0–36.0)
MCV: 92.7 fL (ref 78.0–100.0)
RBC: 4.34 MIL/uL (ref 3.87–5.11)

## 2010-05-14 LAB — URINALYSIS, ROUTINE W REFLEX MICROSCOPIC
Ketones, ur: NEGATIVE mg/dL
Nitrite: NEGATIVE
Urobilinogen, UA: 0.2 mg/dL (ref 0.0–1.0)
pH: 7 (ref 5.0–8.0)

## 2010-05-14 LAB — DIFFERENTIAL
Basophils Absolute: 0 10*3/uL (ref 0.0–0.1)
Lymphocytes Relative: 26 % (ref 12–46)
Lymphs Abs: 2.6 10*3/uL (ref 0.7–4.0)
Neutrophils Relative %: 67 % (ref 43–77)

## 2010-05-14 LAB — LIPASE, BLOOD: Lipase: 42 U/L (ref 11–59)

## 2010-05-14 LAB — COMPREHENSIVE METABOLIC PANEL
BUN: 9 mg/dL (ref 6–23)
CO2: 25 mEq/L (ref 19–32)
Calcium: 9.3 mg/dL (ref 8.4–10.5)
Chloride: 107 mEq/L (ref 96–112)
Creatinine, Ser: 0.81 mg/dL (ref 0.4–1.2)
GFR calc Af Amer: >60 mL/min (ref >60)
GFR calc non Af Amer: >60 mL/min (ref >60)
Glucose, Bld: 105 mg/dL — ABNORMAL HIGH (ref 70–99)
Total Bilirubin: 0.3 mg/dL (ref 0.3–1.2)

## 2010-05-14 LAB — POCT PREGNANCY, URINE: Preg Test, Ur: NEGATIVE

## 2010-05-14 LAB — PROTIME-INR: Prothrombin Time: 13.7 seconds (ref 11.6–15.2)

## 2010-05-14 LAB — URINE CULTURE: Colony Count: NO GROWTH

## 2010-05-15 NOTE — Progress Notes (Signed)
Summary: [facility]-pain management  Phone Note Call from Patient Call back at Home Phone [phone]   Caller: Patient Call For: Christy Bars DO Summary of Call: Pt left voicemail with 2 issues: 1) pt is unable to drive to [place] to pain management every 30 days for visit for refills of pain meds.  Would like a pain management referral to clinic in [place]. 2) husband is changing jobs so he can stay in [place], pt will be with out insurance for 2 months.  If I understood message correctly pt needs someone who will refill her meds before her insurance runs out and to give her a quantity sufficient to last until she has insurance again. LM to [facility] at home number Christy Shaffer CMA Duncan Dull)  April 25, 2010 10:14 AM  Initial call taken by: Christy Shaffer CMA Duncan Dull),  April 25, 2010 10:14 AM  Follow-up for Phone Call        I will fill her percocet if she needs it.  Since she is moving, no need to set her up with pain clinic. Follow-up by: Christy Bars DO,  May 09, 2010 8:03 AM  Additional Follow-up for Phone Call Additional follow up Details #1::        [facility] for Pt to [facility] Additional Follow-up by: Payton Spark CMA,  May 09, 2010 10:56 AM

## 2010-05-15 NOTE — Letter (Signed)
Summary: [facility]  [facility]   Imported By: Maryln Gottron 05/07/2010 15:24:52  _____________________________________________________________________  External Attachment:    Type:   Image     Comment:   External Document

## 2010-05-21 LAB — CBC
HCT: 43.6 % (ref 36.0–46.0)
MCHC: 32.8 g/dL (ref 30.0–36.0)
MCHC: 33.7 g/dL (ref 30.0–36.0)
MCV: 93.4 fL (ref 78.0–100.0)
Platelets: 152 10*3/uL (ref 150–400)
Platelets: 191 10*3/uL (ref 150–400)
RBC: 3.57 MIL/uL — ABNORMAL LOW (ref 3.87–5.11)
RDW: 13 % (ref 11.5–15.5)
WBC: 10.8 10*3/uL — ABNORMAL HIGH (ref 4.0–10.5)

## 2010-06-04 LAB — POCT PREGNANCY, URINE: Preg Test, Ur: NEGATIVE

## 2010-06-14 ENCOUNTER — Other Ambulatory Visit: Payer: Self-pay | Admitting: Family Medicine

## 2010-06-14 DIAGNOSIS — Z1239 Encounter for other screening for malignant neoplasm of breast: Secondary | ICD-10-CM

## 2010-07-03 ENCOUNTER — Ambulatory Visit (INDEPENDENT_AMBULATORY_CARE_PROVIDER_SITE_OTHER): Payer: Private Health Insurance - Indemnity | Admitting: Family Medicine

## 2010-07-03 ENCOUNTER — Encounter: Payer: Self-pay | Admitting: Family Medicine

## 2010-07-03 DIAGNOSIS — F329 Major depressive disorder, single episode, unspecified: Secondary | ICD-10-CM

## 2010-07-03 DIAGNOSIS — F3289 Other specified depressive episodes: Secondary | ICD-10-CM

## 2010-07-03 DIAGNOSIS — N911 Secondary amenorrhea: Secondary | ICD-10-CM

## 2010-07-03 DIAGNOSIS — R5383 Other fatigue: Secondary | ICD-10-CM

## 2010-07-03 DIAGNOSIS — M2550 Pain in unspecified joint: Secondary | ICD-10-CM

## 2010-07-03 DIAGNOSIS — Z13220 Encounter for screening for lipoid disorders: Secondary | ICD-10-CM

## 2010-07-03 NOTE — Patient Instructions (Signed)
Labs today. Will call you w/ results tomorrow. 

## 2010-07-03 NOTE — Assessment & Plan Note (Signed)
Overall, her mood has improved some but is complicated by the chronic pain (and frustration)of her trigeminal neuralgia managed by [facility] neurology.  She did counseling but couldn't afford to go back and has not been able to tolerate any SSRIs or SNRIs tried.  She is having more diffuse aches and pains.  I'd like her to work on relaxation and regular exercise.

## 2010-07-03 NOTE — Progress Notes (Signed)
  Subjective:    Patient ID: Christy Shaffer, female    DOB: 1971/01/23, 40 y.o.   MRN: [id number]  HPI 40 yo WF presents for f/u visit.  She is off Fluoxetine.  She thougt she didn't need it.  Using Alprazolam around her period 2 x a month for anxiety.  She saw the therapist but it was 200 dollars.  She was under stress with selling the house, her husband not being around.  She tried running but she felt bad.  Her husband is around more and they are not moving to [facility] now.  Her mood is complicated by the pain from her trigeminal neuralgia.  She is on Tregetrol and Oxycodone as needed.  She has aches and pains in her joints, currently in the R elbow w/o trauma, swelling, redness or heat.  BP 115/77  Pulse 70  Ht 5' 6.5" (1.689 m)  Wt 164 lb (74.39 kg)  BMI 26.07 kg/m2  SpO2 98%    Review of Systems  Constitutional: Positive for fatigue. Negative for fever and unexpected weight change.  HENT: Negative for facial swelling.   Eyes: Negative for visual disturbance.  Respiratory: Negative for chest tightness and shortness of breath.   Cardiovascular: Negative for chest pain, palpitations and leg swelling.  Neurological: Positive for headaches. Negative for speech difficulty.  Psychiatric/Behavioral: Positive for dysphoric mood. Negative for suicidal ideas, sleep disturbance and self-injury. The patient is nervous/anxious.        Objective:   Physical Exam  Constitutional: She appears well-developed and well-nourished.  HENT:  Head: Normocephalic and atraumatic.  Eyes: Conjunctivae are normal. No scleral icterus.  Neck: Neck supple. No thyromegaly present.  Cardiovascular: Normal rate and regular rhythm.   Pulmonary/Chest: Effort normal and breath sounds normal. No respiratory distress. She has no wheezes.  Musculoskeletal: She exhibits no edema.       No joint effusion.  Full active R elbow ROM  Lymphadenopathy:    She has no cervical adenopathy.  Neurological:       No tremor  Skin: Skin  is warm and dry. No erythema.  Psychiatric: Judgment normal. Her mood appears not anxious. Her affect is not blunt. Her speech is not rapid and/or pressured. She is not agitated and not withdrawn. Cognition and memory are normal. She does not exhibit a depressed mood.          Assessment & Plan:

## 2010-07-04 ENCOUNTER — Telehealth: Payer: Self-pay | Admitting: Family Medicine

## 2010-07-04 DIAGNOSIS — G500 Trigeminal neuralgia: Secondary | ICD-10-CM

## 2010-07-04 LAB — COMPLETE METABOLIC PANEL WITH GFR
Albumin: 4.4 g/dL (ref 3.5–5.2)
CO2: 22 mEq/L (ref 19–32)
Chloride: 108 mEq/L (ref 96–112)
GFR, Est African American: >60 mL/min (ref >60)
GFR, Est Non African American: >60 mL/min (ref >60)
Glucose, Bld: 83 mg/dL (ref 70–99)
Potassium: 4.2 mEq/L (ref 3.5–5.3)
Sodium: 140 mEq/L (ref 135–145)
Total Protein: 6.8 g/dL (ref 6.0–8.3)

## 2010-07-04 LAB — CBC WITH DIFFERENTIAL/PLATELET
Eosinophils Absolute: 0.1 10*3/uL (ref 0.0–0.7)
Eosinophils Relative: 2 % (ref 0–5)
HCT: 41.6 % (ref 36.0–46.0)
Lymphs Abs: 1.9 10*3/uL (ref 0.7–4.0)
MCHC: 32.9 g/dL (ref 30.0–36.0)
Monocytes Relative: 6 % (ref 3–12)
Neutrophils Relative %: 59 % (ref 43–77)
RDW: 13.7 % (ref 11.5–15.5)
WBC: 5.8 10*3/uL (ref 4.0–10.5)

## 2010-07-04 LAB — SEDIMENTATION RATE: Sed Rate: 4 mm/hr (ref 0–22)

## 2010-07-04 NOTE — Telephone Encounter (Signed)
Pt aware of the above  

## 2010-07-04 NOTE — Telephone Encounter (Signed)
Pt called and went to see Dr. Jordan Likes yesterday and had a catatrosphic experience in that office yesterday.  Does not want to return.  Spoke with Janalyn Rouse office yesterday and wishes to be referred there.  Please advise. Plan:  Routed to provider for review since referral services are important. Jarvis Newcomer, LPN Domingo Dimes

## 2010-07-04 NOTE — Telephone Encounter (Signed)
Pls let pt know that her blood counts, inflammatory markers, thyroid function, fasting sugar, liver and kidney function came back normal.  Cholesterol is a little high.  She is definitely PRE menopausal.    I will be happy to get her in with Dr Oneal Grout.   I do think the best plan of care is to do coiunseling, manage her pain and get her back into exercise.

## 2010-07-05 ENCOUNTER — Telehealth: Payer: Self-pay | Admitting: Family Medicine

## 2010-07-05 DIAGNOSIS — M25521 Pain in right elbow: Secondary | ICD-10-CM

## 2010-07-05 LAB — COMPREHENSIVE METABOLIC PANEL
AST: 20 U/L
BUN: 8 mg/dL (ref 4–21)
Calcium: 9.4 mg/dL
Chloride: 105 mmol/L
Total Protein: 7.1 g/dL

## 2010-07-05 NOTE — Telephone Encounter (Signed)
Pt calling and has question if she is pre menopausal or not??? Erskine Squibb had called her.   Thinks she was told pre menopausal.  Is this correct?? Also pt wants to know what she can do about her elbow.  Obviously, she said she had already mentioned to the provider but hasn't gotten a reply.  Please advise. Plan:  Routed to [facility], CMA Jarvis Newcomer, LPN Domingo Dimes

## 2010-07-05 NOTE — Telephone Encounter (Signed)
YES - she is PRE menopausal. The lab only tells [place] pre or post but if she is having menopausal symptoms, she is likely PERI-menopausal. Her elbow exam was normal.  We can get her in with sports medicine.

## 2010-07-09 NOTE — Telephone Encounter (Signed)
Pt notified of her question regarding menopause state.  Told her she is in pre menopause.  Told her that the lab only tells [place] pre or post but if she is having menopausal symptoms she is likely peri menopausal.  Told her her elbow exam normal but will set up with sports medicine.  Pt voiced understanding of all these instructions. Jarvis Newcomer, LPN Domingo Dimes

## 2010-07-10 ENCOUNTER — Ambulatory Visit: Payer: Self-pay

## 2010-07-10 NOTE — Assessment & Plan Note (Signed)
NAME:  Christy Shaffer, Christy Shaffer NO.:  [id number]   MEDICAL RECORD NO.:  [id number]          PATIENT TYPE:  POB   LOCATION:  [facility] [place]         FACILITY:  [facility]   PHYSICIAN:  Elsie Lincoln, MD      DATE OF BIRTH:  Apr 04, 1970   DATE OF SERVICE:                                  CLINIC NOTE   The patient is a 40 year old female who had an endometrial ablation  approximately 4 months ago.  The patient has been having bleeding ever  since, but it sounds like it is mostly postcoital.  The patient also  complaining of pain on the right side of the introitus.  The patient has  never been anemic and her hysteroscopy was unremarkable.  I am hoping  that the bleeding will only prove to be from the ectropion.   PHYSICAL EXAMINATION:  ABDOMEN:  Soft and nontender.  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  See the separate  procedure note.  Her vagina is pink.  Normal rugae.  The cervical  ectropion is bleeding slightly.  The speculum exam, this could possibly  decide if all her bleeding is not actually intrauterine.  Bimanually,  the uterus is anteverted and mobile.  Adnexa; no masses, nontender.   PROCEDURE NOTE:  After an informed consent was obtained, the patient was  placed in dorsal lithotomy position.  The area was cleaned, 1% lidocaine  was injected in the area and a scalpel was used to remove the  papillations from the hormonal ring.  A 3-0 Vicryl was used to sew up  the lesion.  The specimen was sent to Pathology.  The patient was told  not to have sex for several weeks while this healed.   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  1. Papillations removed as described above.  2. We will schedule for cryo at [facility] to see      if this helps her postcoital  bleeding.           ______________________________  Elsie Lincoln, MD     KL/MEDQ  D:  06/22/2008  T:  06/23/2008  Job:  [id number]

## 2010-07-10 NOTE — Assessment & Plan Note (Signed)
NAME:  Christy Shaffer, Christy Shaffer NO.:  [id number]   MEDICAL RECORD NO.:  [id number]          PATIENT TYPE:  POB   LOCATION:  [facility] [place]         FACILITY:  [facility]   PHYSICIAN:  Elsie Lincoln, MD      DATE OF BIRTH:  1970-05-20   DATE OF SERVICE:                                  CLINIC NOTE   The patient is a 40 year old para 2 female, who has been referred to me  from [facility] for menorrhagia.  For the past 2 years,  she has had only about 5 days a month, where she does not bleed.  Her  hemoglobin is above 13, so her perception of menorrhagia is not as heavy  as it is; however, it is bothersome and interrupts her sexual life.  The  patient did have a transvaginal ultrasound done, and there was a too  small leiomyoma within the uterus that did not seem to invade the  submucosal cavity.  There was a 6-mm cystic lesion in the lower uterine  segment either a high nabothian cyst or cystic leiomyoma.  Also, this  could probably be an endometrial polyp as well.  Her endometrium was 11  mm in thickness, and the right and left ovaries were normal with no  cysts.  The patient wants definitive treatment.  She has had Mirena in  the past.  She cannot take birth control pills because she has factor V  Leiden trait.  She is not on heparin at this time.   PAST MEDICAL HISTORY:  1. Factor V Leiden trait.  2. Trigeminal neuralgia for 9 years.   PAST SURGICAL HISTORY:  None.   MENSTRUAL HISTORY:  Menarche at age 14 with continuous bleed in the past  2 years.  Prior to that, had normal periods.  She is not trying to get  pregnant.  Her partner had a vasectomy.  She had 2 vaginal deliveries,  each about 11 pounds.  No history of abnormal Pap smears, ovarian cysts,  or sexually transmitted diseases, and small fibroids as described above.   MEDICATIONS:  Oxycodone p.r.n. for trigeminal neuralgia.   SOCIAL HISTORY:  The patient lives with her spouse and 2 daughters.   She  works outside the home, and drinks 2 caffeinated beverages a day.  No  smoking, drinking, or drugs.   SYSTEMIC REVIEW:  Muscle aches, weight gain, frequent headaches, and  pain with intercourse.   PHYSICAL EXAMINATION:  VITAL SIGNS:  Pulse 93, blood pressure 131/82,  weight 157, height 56.5 inches.  GENERAL:  Well nourished, well developed, in no apparent stress.  ABDOMEN:  Soft, nontender.  No rebound.  No guarding.  No organomegaly.  GENITALIA:  Tanner V.  Vagina pink, normal rugae, small amount of brown  blood at the apex.  Cervix, large and nontender.  Uterus, anteverted and  nontender.  There is 1-2 prolapse of the uterus.  No major cystocele or  rectocele noted.  Nontender over the bladder and urethra.  Adnexa, no  masses and nontender.  EXTREMITIES:  Nontender.   PROCEDURE:  Endometrial biopsy was done.  EPT was negative.  The os was  cleaned  with Betadine, and the anterior lip of the cervix was grasped  with single-tooth tenaculum.  Two passes with the endometrial Pipelle  was made, where the uterus sounded to 8.5 cm.  There was also a cystic  questionable polyp that came out after the Pipelle, and this was removed  with a ring forceps and sent to Pathology in a separate dish.  After the  exam, there was good hemostasis in the cervix.   ASSESSMENT AND PLAN:  A 40 year old female with abnormal uterine  bleeding.  1. Endometrial biopsy.  2. Pap smear was normal this year.  3. We will call the patient with results, and the patient wants      endometrial ablation scheduled before she leaves for [place] in      December.  We will try to comment.           ______________________________  Elsie Lincoln, MD     KL/MEDQ  D:  01/06/2008  T:  01/06/2008  Job:  [id number]

## 2010-07-10 NOTE — Assessment & Plan Note (Signed)
NAME:  Christy Shaffer, Christy Shaffer                 ACCOUNT NO.:  [id number]   MEDICAL RECORD NO.:  [id number]          PATIENT TYPE:  POB   LOCATION:  [facility] [place]         FACILITY:  [facility]   PHYSICIAN:  Allie Bossier, MD        DATE OF BIRTH:  1970-06-23   DATE OF SERVICE:                                  CLINIC NOTE   Christy Shaffer is a 40 year old lady who is now 6 weeks postop status post  TVH by Dr. Penne Lash and a mid urethral sling (Advantage Fit) and  cystoscopy by Dr. Nicholaus Bloom.  She has no complaints.  She denies stress  incontinence.  Her other issue, she has not had intercourse yet.  She  has lost 13 pounds by working on changing her eating habits.   On physical exam, her cuff is well supported.  The incisions from her  Advantage Fit trocars are not even visible.  Her vaginal cuff is well  healed.  Bimanual exam is within normal limits.   ASSESSMENT/PLAN:  Postoperative x6 weeks.  I have recommended that she  return in a year.  We will schedule her for a mammogram at her  convenience.      Allie Bossier, MD     MCD/MEDQ  D:  06/21/2009  T:  06/21/2009  Job:  [id number]

## 2010-07-10 NOTE — Assessment & Plan Note (Signed)
NAME:  MONASIA, LAIR NO.:  [id number]   MEDICAL RECORD NO.:  [id number]          PATIENT TYPE:  POB   LOCATION:  [facility] [place]         FACILITY:  [facility]   PHYSICIAN:  Elsie Lincoln, MD      DATE OF BIRTH:  [birth year]   DATE OF SERVICE:  09/20/2009                                  CLINIC NOTE   The patient is a 40 year old female who presents for burning with  urination.  As I spoke to the patient, the burning is mostly when the  urine hits the vagina.  The patient states that she has been treated for  urinary tract infections, but does not believe it is done here, it has  been done in [place] recently on vacation.  The patient does not feel  that she completely empties her bladder.  She has to wait and allow for  the urine stream to start.  She also has to feel like repeat as to be  immediately after finishing urinating.  We talked about getting a  postvoid residual.  She would like to pursue that next week as she has  some place to go today.  Her urine was negative here on September 01, 2009,  and I am getting the urinalysis today.  The patient recently tried  Monistat over-the-counter which caused more vaginal burning and  explained this can happen with some of the creams that contain alcohol  can further irritate the vagina.   PHYSICAL EXAMINATION:  There is some scant discharge that is adherent to  the vaginal wall, it is more consistent with bacterial vaginosis, but I  am getting wet prep.  The vaginal vault is intact.  On bimanual, slight  edge of the sling can be felt anteriorly, but this is not bothering the  patient.  She is nontender over this area, and Dr. Marice Potter placed a sling,  and will have her come back in a week.  Dr. 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  I think that  we should get a postvoid residual and make sure that the  patient is not  retaining urine.           ______________________________  Elsie Lincoln, MD     KL/MEDQ  D:  09/20/2009  T:  09/21/2009  Job:  [id number]

## 2010-07-10 NOTE — Group Therapy Note (Signed)
NAME:  Christy Shaffer, Christy Shaffer NO.:  [id number]   MEDICAL RECORD NO.:  [id number]          PATIENT TYPE:  WOC   LOCATION:  [facility]                   FACILITY:  [facility]   PHYSICIAN:  Jaynie Collins, MD     DATE OF BIRTH:  1970-06-27   DATE OF SERVICE:  08/18/2008                                  CLINIC NOTE   The patient is a 40 year old gravida 2, para 2, who is followed at the  [facility] with a chief complaint of postcoital bleeding on an  examination done by Dr. Penne Lash on June 22, 2008.  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  The patient was told to  come here at [facility] to undergo cryotherapy to see if this can  help with her ectropion bleeding.  The patient reports no other concerns  or complaints.   CRYOTHERAPY:  The patient underwent informed consent and signed a  written consent for the cryotherapy procedure.  She was then placed in a  dorsal lithotomy position and a vaginal speculum was placed in her  vagina.  She had a normal pink well rugated vagina and prominent  cervical ectropion was noted using about a 1.5 cm diameter probe.  Cryotherapy was administered to the cervical ectropion and liquid  nitrogen was activated.  There was significant freezing effect noted on  the ectropion and 2 cycles of 4 minutes was done with an intravenous 1  cycle of 3 minutes.  Good freezing effect was noted.  All instruments  were then removed from the patient's vagina.   IMPRESSION:  The patient is a 39 year old gravida 2, para 2, who is here  for postcoital bleeding.  She is now status post cryotherapy of her  cervical ectropion.  The patient was given the routine post cryotherapy  instructions including pelvic rest for 3 weeks and to expect some  vaginal bleeding as cells die and/or get sloughed off.  The patient will  follow up at the [facility] for any further gynecologic  concerns.      ______________________________  Jaynie Collins, MD     UA/MEDQ  D:  08/18/2008  T:  08/19/2008  Job:  [id number]

## 2010-07-10 NOTE — Assessment & Plan Note (Signed)
NAME:  Christy Shaffer, Christy Shaffer NO.:  [id number]   MEDICAL RECORD NO.:  [id number]          PATIENT TYPE:  POB   LOCATION:  [facility] [place]         FACILITY:  [facility]   PHYSICIAN:  Elsie Lincoln, MD      DATE OF BIRTH:  02-Dec-1970   DATE OF SERVICE:  03/22/2009                                  CLINIC NOTE   HISTORY OF PRESENT ILLNESS:  The patient is a 40 year old female who  presents for metrorrhagia after ablation and cryotherapy.  The patient  believes approximately 2 to 2-1/2 weeks a month her hemoglobin is  normal.  She is not a candidate for hormonal therapy because she is  factor V Leiden positive.  She is very annoyed with this bleeding  profile.  It is serially affecting her sex life.  She has also tried  Mirena in the past which did not work.  At this point, she would like  definitive surgical therapy and it is a reasonable option.  The patient  is also complaining of stress urinary incontinence.  She is not able to  laugh without leaking urine.  She is not able to do jumping jacks or  trouble in chair playing or other jumping movements without leaking  urine.  The patient is going to follow up with Dr. Marice Potter next week to  evaluate for sling.  Given that it has been 2 years since endometrial  biopsy was done, I think that it would be appropriate to get endometrial  biopsy today to rule out any hyperplasia or malignancy prior to  hysterectomy and also that we should get another transvaginal ultrasound  to make sure that her uterus is still normal size before proceeding with  transvaginal hysterectomy.  The patient was consented for the procedure.  The risks include but not limited to bleeding, infection, and damage to  intra-abdominal organs.  She also risk for clot given her factor V  Leiden, so we used SCDs and early ambulation.  UCG today is negative.   ENDOMETRIAL BIOPSY:  After informed consent was obtained, the patient  was placed in dorsal lithotomy  position.  Cervix was cleaned with  Betadine.  The anterior lip of the cervix was grasped with a single-  tooth tenaculum.  The uterus sounded to 8 cm and two passes were taken  with adequate tissue.  Of note, the uterus prolapsed to grade 1-2 with  Valsalva.   ASSESSMENT AND PLAN:  A 40 year old female with metrorrhagia that does  not respond to conservative therapy.  1. Transvaginal ultrasound.  2. Follow up endometrial biopsy.  3. Follow up with Dr. Marice Potter to evaluate for sling.  4. Schedule for transvaginal hysterectomy and sling.           ______________________________  Elsie Lincoln, MD     KL/MEDQ  D:  03/22/2009  T:  03/23/2009  Job:  [id number]

## 2010-07-10 NOTE — Assessment & Plan Note (Signed)
NAME:  Christy Shaffer, Christy Shaffer                 ACCOUNT NO.:  [id number]   MEDICAL RECORD NO.:  [id number]          PATIENT TYPE:  POB   LOCATION:  [facility] [place]         FACILITY:  [facility]   PHYSICIAN:  Allie Bossier, MD        DATE OF BIRTH:  September 04, 1970   DATE OF SERVICE:  11/01/2009                                  CLINIC NOTE   Christy Shaffer is a 40 year old married white lady who is here because she  reports approximately 3 weeks of vaginal burning.  She describes that  the right side of the vaginal wall worse than the left side.  She had  intercourse recently and she said that it hurt like hell due to  burning, and she also describes extremely dry vagina, and thinks that  her hormones are not right.  She used an over-the-counter yeast  medicine last night that gave her a great relief.   PHYSICAL EXAMINATION:  Her vaginal introitus is somewhat redder on the  right-hand side, however, discharge appears normal.  Her mucosa looks  relatively well estrogenized.   ASSESSMENT AND PLAN:  Vaginal discomfort, especially with sex.  I am  checking a wet prep, HSV 2 IgG, and an FSH.  I offered to call these  results to her next week; however, she would like to come in for  followup visit and so we will have her back next week to go over the  results.      Allie Bossier, MD     MCD/MEDQ  D:  11/01/2009  T:  11/01/2009  Job:  [id number]

## 2010-07-10 NOTE — Assessment & Plan Note (Signed)
NAME:  Christy Shaffer, BRADBURN                 ACCOUNT NO.:  [id number]   MEDICAL RECORD NO.:  [id number]          PATIENT TYPE:  POB   LOCATION:  [facility] [place]         FACILITY:  [facility]   PHYSICIAN:  Allie Bossier, MD        DATE OF BIRTH:  03/22/1970   DATE OF SERVICE:  03/08/2009                                  CLINIC NOTE   HISTORY OF PRESENT ILLNESS:  Ms. Christy Shaffer is a 40 year old married white  gravida 2, para 2 with 34 and 38 year old children.  She comes in here  today for followup of her insomnia and her complaint that her period is  lasting up to 10 days a month.  She has been seen here for the last  year.  Dr. Penne Lash saw her earlier in 2010 for complaint of  dysfunctional uterine bleeding.  She in fact did an endometrial ablation  and a D and C (hydrothermal ablation) on January 14, 2008.  Shantoya says  that with regard to the bleeding that she still has bleeding enough that  inconveniences her and decreases her enjoyment of daily living.  I  checked hemoglobin to evaluate this in July 2010, it was within normal  limits at 1.9.  She also complains of the same issue of insomnia that  she described to me back in September 2010.  At that time, I had given  her Elavil.  She said, however, that she would feel too sleepy in the  morning and quit taking the Elavil and that she would like to restart  her previous medication of Prozac to be used in combination with Ambien.  So in September I  wrote her a prescription for Ambien and prescription  for Prozac.  When she saw her pain management doctor (trigeminal  neuralgia), Dr. Vear Clock, he was happy with her using the Elavil, but  then she ended up getting a call from pharmacy called [facility], that there  was a risk of death when combining the oxycodone that she is on for  trigeminal neuralgia and either the Prozac or the Elavil, so she stopped  taking both.  She says that she feels depressed, so I have spoken with a  pharmacy at [facility]  and they can find no evidence that there is  drug interactions between oxycodone and Elavil and/or oxycodone and  Prozac.  Therefore, per her request restarted her Prozac (20 mg a day)  as well as on some Ambien to be used for insomnia on a p.r.n. basis.  She also complains of heart palpitations.  She tells me that this is  relatively new onset, but when I look back, it seems that she complained  of this last year to Dr. Penne Lash.  The patient has no internal medicine  doctor, so I am referring her to [facility] in [place] for  further evaluation of this.   ASSESSMENT AND PLAN:  1. Dysfunctional uterine bleeding status post endometrial ablation.  I      am ordering an ultrasound and a CBC to further evaluate this issue.  2. Palpitations.  I am sending her to of [facility] for this  chronic issue.  3. Depression.  I have explained at length that I feel that her      depression is magnifying her other complaints and that, that we      need to get her depression under control prior to thinking of any      other treatments for her bleeding.  She wishes to have a      hysterectomy, however, I have counseled there are risks associated      with surgeries and I would not consider that until I know that her      depression is under control and not clouding her judgment.  I will      see her back in a month.      Allie Bossier, MD    MCD/MEDQ  D:  03/08/2009  T:  03/09/2009  Job:  [id number]

## 2010-07-10 NOTE — Assessment & Plan Note (Signed)
NAME:  BONETTA, MOSTEK NO.:  [id number]   MEDICAL RECORD NO.:  [id number]           PATIENT TYPE:   LOCATION:  [facility] [place]           FACILITY:   PHYSICIAN:  Allie Bossier, MD        DATE OF BIRTH:  1970/08/05   DATE OF SERVICE:                                  CLINIC NOTE   Madicyn had undergone a D and C, an endometrial ablation and hysteroscopy  on January 14, 2008.  She comes back today for her 6-week followup.  She did have a period at the end of December.  She said it still lasted  her usual 8 days, but it was significantly lighter.  Of note, she tells  me that her husband had a vasectomy in September 2009, but has not yet  gone for followup to confirm absence of sperm.  I have certainly told  her that pregnancy after ablation is a bad idea and that he should  follow up as recommended.  Her other complaint today is that of a 3- to  4-year history of what sounds like PMDD.  She has approximately only 1  week out of a month that is a good week.  The rest of the time she  feels very moody and anxious.  I have suggested Serevent 20 mg in the  morning and she is agreeable to this.  She will follow up in a month,  and we will see if her dose or medication needs to be adjusted.      Allie Bossier, MD     MCD/MEDQ  D:  03/09/2008  T:  03/09/2008  Job:  [id number]

## 2010-07-10 NOTE — Op Note (Signed)
NAME:  Christy Shaffer, Christy Shaffer                 ACCOUNT NO.:  [id number]   MEDICAL RECORD NO.:  [id number]          PATIENT TYPE:  AMB   LOCATION:  [facility]                           [facility]   PHYSICIAN:  Lesly Dukes, M.D. DATE OF BIRTH:  1970-12-22   DATE OF PROCEDURE:  01/14/2008  DATE OF DISCHARGE:                               OPERATIVE REPORT   PREOPERATIVE DIAGNOSIS:  The patient is a 40 year old female with  abnormal uterine bleeding and normal endometrial biopsy.   POSTOPERATIVE DIAGNOSIS:  The patient is a 40 year old female with  abnormal uterine bleeding and normal endometrial biopsy.   PROCEDURES:  1. Dilation hysteroscopy.  2. Hydrothermal ablation.   SURGEON:  Lesly Dukes, MD   ANESTHESIA:  General.   FINDINGS:  No submucosal fibroids or polyps.   PATHOLOGY:  None.   ESTIMATED BLOOD LOSS:  None.   COMPLICATIONS:  None.   DESCRIPTION OF PROCEDURE:  After informed consent was obtained, the  patient was taken to the operating room where general anesthesia was  induced.  The patient was placed in dorsal lithotomy position, and  prepared and draped in normal sterile fashion. The bladder was emptied  with a catheter. A bivalved speculum was placed into the patient's  vagina and the anterior lip of the cervix was grasped with a single-  tooth tenaculum.  The cervical os was gently dilated to an 8.5 Hegar  dilator. The hysteroscope was inserted and hysteroscopy was performed.  There was no intracavitary lesions.  The hydrothermal ablation by [employer] was employed to ablate the endometrial cavity.  There was an  appropriate warm-up period with no fluid loss, a 10-minute ablation, and  a 1-minute cool down.  At the end of the procedure there was good  hemostasis in the cervix.  The patient tolerated it well.  All sponge,  lap, instrument, and needle counts were correct x2 and the patient went  to recovery room in stable condition.     Lesly Dukes,  M.D.  Electronically Signed    KHL/MEDQ  D:  01/14/2008  T:  01/15/2008  Job:  [id number]

## 2010-07-10 NOTE — Assessment & Plan Note (Signed)
NAME:  Christy Shaffer, Christy Shaffer NO.:  [id number]   MEDICAL RECORD NO.:  [id number]          PATIENT TYPE:  POB   LOCATION:  [facility] [place]         FACILITY:  [facility]   PHYSICIAN:  Allie Bossier, MD        DATE OF BIRTH:  1970/12/03   DATE OF SERVICE:  09/27/2009                                  CLINIC NOTE   [name] is a 40 year old lady who was seen last week by Dr. Penne Lash for  what the patient describes as pain and she states of pain that she  thinks is related to a bladder infection.  Dr. Penne Lash did an urinalysis  and it was negative, so she had to come back today.  Today, her  urinalysis shows 2+ blood, 2+ leukocyte esterase, and I believe that the  patient is correct that she does have a bladder infection.  I am  treating her with Bactrim DS 1 p.o. b.i.d. for a week as well as Urispas  for the next several days on a p.r.n. basis.  Please note that she had  taken Cipro in [place], but felt that it never fully relieved her  symptoms.  We have a culture that is pending at this time.  If she has  any further bladder symptoms, I will refer her to an urologist.      Allie Bossier, MD     MCD/MEDQ  D:  09/27/2009  T:  09/27/2009  Job:  [id number]

## 2010-07-11 ENCOUNTER — Telehealth: Payer: Self-pay | Admitting: Family Medicine

## 2010-07-11 NOTE — Telephone Encounter (Signed)
Pt calling because she has not heard anything regarding her referrals for pain mgmt, and her elbow.  I see where she is to go to ortho surgeon, and to refer to diff pain mgmt group [facility] Smart here in [place].  Please advise if appts/dates/times have been confirmed.  Let pt know.  Thanks. Plan:  Routed to [facility]/referrals Belva Koziel, LPN [facility]

## 2010-07-20 ENCOUNTER — Telehealth: Payer: Self-pay | Admitting: Family Medicine

## 2010-07-20 NOTE — Telephone Encounter (Signed)
After trying a couple of different pain clinics, [facility] is reviewing patient's chart and states that it will be about 3-4 weeks until they can get her in their office and the patient states that she is running out of meds and will need a bridge. Thanks

## 2010-07-21 NOTE — Telephone Encounter (Signed)
I will be happy to bridge her.  Let me know when she will need this starting from.

## 2010-07-24 NOTE — Telephone Encounter (Signed)
i need confirmation of her previous dose from the pharmacy before printing this.

## 2010-07-24 NOTE — Telephone Encounter (Signed)
Needs Oxycontin 15mg  with no Tylenol, She usually takes 80-100mg  a day ( depending on pain level) .Marland Kitchen She will pick up her Rx. On 07-25-10... Thanks, [employer]

## 2010-07-24 NOTE — Telephone Encounter (Signed)
Pt last filled Oxycontin 20mg  1 tab po bid on 05/14/2010 at [facility]

## 2010-07-25 ENCOUNTER — Ambulatory Visit: Payer: Private Health Insurance - Indemnity | Admitting: Obstetrics & Gynecology

## 2010-07-25 NOTE — Telephone Encounter (Signed)
She was given RX for Oxycontin 20 mg bid #42 on 5-24.

## 2010-07-27 NOTE — Telephone Encounter (Signed)
Patient came into the office yesterday and I have tried 3 Pain management doctors to accept her and 1 said no and the other two , we are waiting to hear back from. Patient is aware of the status and she was here with me in the office and I called to f/u on her referrals and was told they are still under Dr's. Review. Thanks

## 2010-07-27 NOTE — Telephone Encounter (Signed)
Christy Shaffer replied regarding this patient and she has contacted the patient with the updated status of the referral for pain mgnt. Jarvis Newcomer, LPN [facility]

## 2010-07-31 ENCOUNTER — Ambulatory Visit
Admission: RE | Admit: 2010-07-31 | Discharge: 2010-07-31 | Disposition: A | Discharge status: Home / Self Care | Payer: Private Health Insurance - Indemnity | Source: Ambulatory Visit | Attending: Family Medicine | Admitting: Family Medicine

## 2010-07-31 DIAGNOSIS — Z1239 Encounter for other screening for malignant neoplasm of breast: Secondary | ICD-10-CM

## 2010-08-02 ENCOUNTER — Telehealth: Payer: Self-pay | Admitting: Family Medicine

## 2010-08-02 NOTE — Telephone Encounter (Signed)
Pls make sure pt know that her screening mammo was abnromal and she is to go back for a R sided diagnostic mammogram for furhter review.

## 2010-08-03 ENCOUNTER — Other Ambulatory Visit: Payer: Self-pay | Admitting: Family Medicine

## 2010-08-03 DIAGNOSIS — R928 Other abnormal and inconclusive findings on diagnostic imaging of breast: Secondary | ICD-10-CM

## 2010-08-03 NOTE — Telephone Encounter (Signed)
Pt aware and has already been

## 2010-08-07 ENCOUNTER — Ambulatory Visit (INDEPENDENT_AMBULATORY_CARE_PROVIDER_SITE_OTHER): Payer: Private Health Insurance - Indemnity | Admitting: Obstetrics & Gynecology

## 2010-08-07 DIAGNOSIS — R6882 Decreased libido: Secondary | ICD-10-CM

## 2010-08-07 DIAGNOSIS — F329 Major depressive disorder, single episode, unspecified: Secondary | ICD-10-CM

## 2010-08-07 DIAGNOSIS — F3289 Other specified depressive episodes: Secondary | ICD-10-CM

## 2010-08-08 NOTE — Assessment & Plan Note (Unsigned)
NAME:  Christy Shaffer, Christy Shaffer NO.:  [id number]  MEDICAL RECORD NO.:  [id number]           PATIENT TYPE:  LOCATION:  [facility] [place]           FACILITY:  PHYSICIAN:  Allie Bossier, MD             DATE OF BIRTH:  DATE OF SERVICE:  08/07/2010                                 CLINIC NOTE  [name] is a 40 year old married white G2, P2, who has been a long- time patient here.  She comes in with several complaints, the biggest of which is depression.  I have been treating her with Prozac 10 mg a day for some months.  She says that she does not feel particularly better. She has thought of harming herself, but does not have a plan and does not really want to kill herself.  She just has thoughts that she does not want to live with her continued facial pain indefinitely.  She denies homicidal ideation.  She has been to see [facility], but says they are too expensive and she does not want to continue seeing them.  She was recently diagnosed with fibromyalgia by physicians at [facility] and she will be going back to [facility] again this Thursday for followup.  Of note, she told me they put her on Savella and she took it for 2 weeks, but did not like the side effects (fatigue, chills) and so, she quit taking it.  She is requesting that I increase her fluoxetine which I certainly agree with.  Her second complaint is that of waking up at approximately 2, 3, or 4 a.m. almost every night feeling sweaty all over her body.  An FSH was recently drawn which was 5 and an estradiol level was also drawn and it was normal.  I have explained that she is not menopausal and that as we grow older, our bodies do change and sleep disturbances are very common for women in [place].  She also complains of decreased-to-no libido.  I have offered her testosterone ointment to see if this helps anything, but I have also explained that depression and fluoxetine both decrease libido.  Her other complaint  is that of a recent abnormal mammogram on the right, the left appeared normal.  She has not palpated any abnormalities.  I did a thorough breast exam today.  There are no abnormalities in the seated or supine position.  She has no masses, no nipple discharge or skin changes.  She is scheduled to go Friday for additional views of this breast.  We tried to give her reassurance regarding this.  With regard to her other things, I have increased her fluoxetine to 20 mg daily.  I have added testosterone 2% ointment to be given 3 times a week in a small pea-sized dose and I will see her back in 4 weeks to reevaluate these things.     Allie Bossier, MD    MCD/MEDQ  D:  08/07/2010  T:  08/08/2010  Job:  [id number]

## 2010-08-10 ENCOUNTER — Ambulatory Visit
Admission: RE | Admit: 2010-08-10 | Discharge: 2010-08-10 | Disposition: A | Discharge status: Home / Self Care | Payer: Private Health Insurance - Indemnity | Source: Ambulatory Visit | Attending: Family Medicine | Admitting: Family Medicine

## 2010-08-10 ENCOUNTER — Telehealth: Payer: Self-pay | Admitting: *Deleted

## 2010-08-10 DIAGNOSIS — R928 Other abnormal and inconclusive findings on diagnostic imaging of breast: Secondary | ICD-10-CM

## 2010-08-10 MED ORDER — OXYCODONE HCL 20 MG PO TB12: 20.0000 mg | ORAL_TABLET | Freq: Two times a day (BID) | ORAL | Status: DC | PRN

## 2010-08-10 NOTE — Telephone Encounter (Signed)
Pt would like to pick up refill of oxycodone this afternoon. Please advise.

## 2010-08-13 ENCOUNTER — Telehealth: Payer: Self-pay | Admitting: Family Medicine

## 2010-08-13 NOTE — Telephone Encounter (Signed)
Patient called and left a message on the voice mail stating how upset she is that her pain medicine is not efficient enough to work like her usual pain regimen. She states that [place] informed her to take Tylenol and that does not work with her painful  Nerve issues. Patient seemed to be very stressed  and upset that we have have not gave her the right amount of pain med to last her until her pain management appt.  I myself just wanted to let Dr. Cathey Endow know what was said on the voicemail since the patient is so upset. Thanks

## 2010-08-19 NOTE — Telephone Encounter (Signed)
This pt is now being followed by Dr Azzie Glatter for pain management.  I received her last OV note and she was actually seen prior to the date that I wrote her pain meds for.  I have tried for a wk to speak personally to Dr Azzie Glatter.  It is my policy with patients that I will only bridge them with narcotics until they can see pain management and since she IS seeing pain managemnt, she needs to go through their office for pain meds.  Pls relay this message to Ms Forstner.

## 2010-09-03 ENCOUNTER — Encounter: Payer: Self-pay | Admitting: *Deleted

## 2010-09-05 ENCOUNTER — Ambulatory Visit: Payer: Private Health Insurance - Indemnity | Admitting: Obstetrics & Gynecology

## 2010-09-05 ENCOUNTER — Telehealth: Payer: Self-pay | Admitting: *Deleted

## 2010-09-05 NOTE — Telephone Encounter (Signed)
Pt called requesting Prozac 20mg  be increased to 40mg .  Pt states that she talked with Dr Marice Potter on last visit.  Pt is requesting a 90 day supply be called to [facility] [address] [facility].  Spoke with Dr Marice Potter who Ok"d PT to increase her Prozac to 40mg  daily.  Med called to pharmacy.

## 2010-09-18 ENCOUNTER — Encounter: Payer: Self-pay | Admitting: Obstetrics & Gynecology

## 2010-09-18 ENCOUNTER — Ambulatory Visit (INDEPENDENT_AMBULATORY_CARE_PROVIDER_SITE_OTHER): Payer: Private Health Insurance - Indemnity | Admitting: Obstetrics & Gynecology

## 2010-09-18 VITALS — BP 104/69 | HR 91 | Resp 16 | Ht 66.0 in

## 2010-09-18 DIAGNOSIS — M797 Fibromyalgia: Secondary | ICD-10-CM

## 2010-09-18 DIAGNOSIS — F32A Depression, unspecified: Secondary | ICD-10-CM

## 2010-09-18 DIAGNOSIS — F329 Major depressive disorder, single episode, unspecified: Secondary | ICD-10-CM

## 2010-09-18 DIAGNOSIS — IMO0001 Reserved for inherently not codable concepts without codable children: Secondary | ICD-10-CM

## 2010-09-18 MED ORDER — FLUOXETINE HCL 20 MG PO CAPS: 40.0000 mg | ORAL_CAPSULE | Freq: Every day | ORAL | Status: DC

## 2010-09-18 NOTE — Progress Notes (Signed)
  Subjective:    Patient ID: Christy Shaffer, female    DOB: 14-Dec-1970, 40 y.o.   MRN: [id number]  HPI Christy Shaffer has a long history of fibromyalgia and depression.  She was seen here last month complaibning that her depression was worse.  She has now been on 40 mg of Prozac daily and it has helped.  Of note, her 40 yo just had a elective Ab.   Review of Systems     Objective:   Physical Exam    N/A    Assessment & Plan:  Depression: improved on 40 mg Prozac Follow up in 6 months.

## 2010-09-19 ENCOUNTER — Ambulatory Visit: Payer: Private Health Insurance - Indemnity | Admitting: Obstetrics & Gynecology

## 2010-10-11 ENCOUNTER — Encounter: Payer: Self-pay | Admitting: Emergency Medicine

## 2010-10-11 ENCOUNTER — Inpatient Hospital Stay (INDEPENDENT_AMBULATORY_CARE_PROVIDER_SITE_OTHER)
Admission: RE | Admit: 2010-10-11 | Discharge: 2010-10-11 | Disposition: A | Discharge status: Home / Self Care | Payer: Private Health Insurance - Indemnity | Source: Ambulatory Visit | Attending: Emergency Medicine | Admitting: Emergency Medicine

## 2010-10-11 DIAGNOSIS — N390 Urinary tract infection, site not specified: Secondary | ICD-10-CM

## 2010-10-11 LAB — CONVERTED CEMR LAB
Bilirubin Urine: NEGATIVE
Ketones, urine, test strip: NEGATIVE
Specific Gravity, Urine: >=1.03
pH: 5.5

## 2010-10-14 ENCOUNTER — Telehealth (INDEPENDENT_AMBULATORY_CARE_PROVIDER_SITE_OTHER): Payer: Self-pay | Admitting: Emergency Medicine

## 2010-11-16 ENCOUNTER — Other Ambulatory Visit: Payer: Self-pay | Admitting: Family Medicine

## 2010-11-27 LAB — CBC
Hemoglobin: 14.1
MCHC: 34
MCV: 91.3
RBC: 4.55

## 2011-01-28 NOTE — Progress Notes (Signed)
Summary: ?UTI   Vital Signs:  Patient Profile:   40 Years Old Female CC:      dysuria x today Height:     66.5 inches Weight:      165 pounds O2 Sat:      99 % O2 treatment:    Room Air Temp:     98.5 degrees F oral Pulse rate:   77 / minute Resp:     16 per minute BP sitting:   114 / 78  (left arm) Cuff size:   regular  Vitals Entered By: Lajean Saver RN (October 11, 2010 11:26 AM)                  Updated Prior Medication List: BACLOFEN 10 MG TABS (BACLOFEN) one tablet by mouth once a day ALPRAZOLAM 0.5 MG TABS (ALPRAZOLAM) 1/2 to 1 tab by mouth two times a day as needed anxiety MORPHINE SULFATE 30 MG TABS (MORPHINE SULFATE) bid  Current Allergies (reviewed today): ! HYDROCODONEHistory of Present Illness History from: patient Chief Complaint: dysuria x today History of Present Illness: 40 Years Old Female complains of UTI symptoms for a few days.  She describes the pain as burning during urination.  She has not used any OTC meds.   + dysuria + frequency + urgency No hematuria No vaginal discharge No fever/chills No lower abdomenal pain No back pain No fatigue   REVIEW OF SYSTEMS Constitutional Symptoms      Denies fever, chills, night sweats, weight loss, weight gain, and fatigue.  Eyes       Denies change in vision, eye pain, eye discharge, glasses, contact lenses, and eye surgery. Ear/Nose/Throat/Mouth       Denies hearing loss/aids, change in hearing, ear pain, ear discharge, dizziness, frequent runny nose, frequent nose bleeds, sinus problems, sore throat, hoarseness, and tooth pain or bleeding.  Respiratory       Denies dry cough, productive cough, wheezing, shortness of breath, asthma, bronchitis, and emphysema/COPD.  Cardiovascular       Denies murmurs, chest pain, and tires easily with exhertion.    Gastrointestinal       Denies stomach pain, nausea/vomiting, diarrhea, constipation, blood in bowel movements, and indigestion. Genitourniary  Complains of painful urination.      Denies blood or discharge from vagina, kidney stones, and loss of urinary control. Neurological       Denies paralysis, seizures, and fainting/blackouts. Musculoskeletal       Denies muscle pain, joint pain, joint stiffness, decreased range of motion, redness, swelling, muscle weakness, and gout.  Skin       Denies bruising, unusual mles/lumps or sores, and hair/skin or nail changes.  Psych       Denies mood changes, temper/anger issues, anxiety/stress, speech problems, depression, and sleep problems.  Past History:  Past Medical History: Reviewed history from 07/09/2006 and no changes required. trigeminal neuralgia (Dr Jordan Likes) Factor V Leiden deficiency TRAIT  Past Surgical History: Reviewed history from 07/19/2009 and no changes required. Hysterectomy 04/2009 Bladder lift March 2011  Family History: Reviewed history from 12/30/2007 and no changes required. mother diverticulosis father healthy brother IBD grandmother CVA's  Social History: Reviewed history from 05/30/2009 and no changes required. 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    Has 2 kids.   Married   Moved from [place] 2006.    Quit smoking in 2001.  Runs  4 days a wk. Alcohol use-no Drug use-no Physical Exam General appearance: well developed, well nourished, no acute  distress Abdomen: soft, non-tender without obvious organomegaly Back: no cva or flank tenderness MSE: oriented to time, place, and person Assessment New Problems: URINARY TRACT INFECTION (ICD-599.0)   Plan New Medications/Changes: PHENAZOPYRIDINE HCL 200 MG TABS (PHENAZOPYRIDINE HCL) 1 by mouth three times a day for 2 days as needed  #6 x 0, 10/11/2010, Hoyt Koch MD CIPROFLOXACIN HCL 500 MG TABS (CIPROFLOXACIN HCL) 1 by mouth two times a day for 5 days  #10 x 0, 10/11/2010, Hoyt Koch MD  New Orders: UA Dipstick w/o Micro (automated)  [81003] T-Culture, Urine [id number] Est.  Patient Level III [id number] Planning Comments:   Hydration, rest.  Use meds as directed.  Urine culture pending.  Follow-up with your primary care physician if not improving or if getting worse   The patient and/or caregiver has been counseled thoroughly with regard to medications prescribed including dosage, schedule, interactions, rationale for use, and possible side effects and they verbalize understanding.  Diagnoses and expected course of recovery discussed and will return if not improved as expected or if the condition worsens. Patient and/or caregiver verbalized understanding.  Prescriptions: PHENAZOPYRIDINE HCL 200 MG TABS (PHENAZOPYRIDINE HCL) 1 by mouth three times a day for 2 days as needed  #6 x 0   Entered and Authorized by:   Hoyt Koch MD   Signed by:   Hoyt Koch MD on 10/11/2010   Method used:   Print then Give to Patient   RxID:   [id number] CIPROFLOXACIN HCL 500 MG TABS (CIPROFLOXACIN HCL) 1 by mouth two times a day for 5 days  #10 x 0   Entered and Authorized by:   Hoyt Koch MD   Signed by:   Hoyt Koch MD on 10/11/2010   Method used:   Print then Give to Patient   RxID:   [id number]   Orders Added: 1)  UA Dipstick w/o Micro (automated)  [81003] 2)  T-Culture, Urine [[id number]] 3)  Est. Patient Level III [27253]    Laboratory Results   Urine Tests  Date/Time Received: October 11, 2010 11:28 AM  Date/Time Reported: October 11, 2010 11:28 AM   Routine Urinalysis   Color: yellow Appearance: Cloudy Glucose: negative   (Normal Range: Negative) Bilirubin: negative   (Normal Range: Negative) Ketone: negative   (Normal Range: Negative) Spec. Gravity: >=1.030   (Normal Range: 1.003-1.035) Blood: 2+   (Normal Range: Negative) pH: 5.5   (Normal Range: 5.0-8.0) Protein: trace   (Normal Range: Negative) Urobilinogen: 0.2   (Normal Range: 0-1) Nitrite: negative   (Normal Range: Negative) Leukocyte Esterace: 2+   (Normal Range:  Negative)    Comments: culture done

## 2011-01-28 NOTE — Telephone Encounter (Signed)
  Phone Note Outgoing Call   Call placed by: Lavell Islam RN,  October 14, 2010 4:12 PM Call placed to: Patient Summary of Call: Message left on voice mail: culture shows RX for ABX appropriate for UTI; complete full course. Encouraged to call with questions or concerns. Initial call taken by: Lavell Islam RN,  October 14, 2010 4:13 PM

## 2011-03-15 ENCOUNTER — Other Ambulatory Visit: Payer: Self-pay | Admitting: Obstetrics & Gynecology

## 2011-03-28 ENCOUNTER — Encounter: Payer: Self-pay | Admitting: Family Medicine

## 2011-03-29 ENCOUNTER — Encounter: Payer: Self-pay | Admitting: Family Medicine

## 2011-03-29 ENCOUNTER — Ambulatory Visit: Payer: Private Health Insurance - Indemnity | Admitting: Family Medicine

## 2011-03-29 ENCOUNTER — Ambulatory Visit (INDEPENDENT_AMBULATORY_CARE_PROVIDER_SITE_OTHER): Payer: Private Health Insurance - Indemnity | Admitting: Family Medicine

## 2011-03-29 VITALS — BP 122/78 | HR 88 | Temp 98.1°F | Ht 66.0 in | Wt 162.8 lb

## 2011-03-29 DIAGNOSIS — F3289 Other specified depressive episodes: Secondary | ICD-10-CM

## 2011-03-29 DIAGNOSIS — F329 Major depressive disorder, single episode, unspecified: Secondary | ICD-10-CM

## 2011-03-29 DIAGNOSIS — G500 Trigeminal neuralgia: Secondary | ICD-10-CM

## 2011-03-29 DIAGNOSIS — K219 Gastro-esophageal reflux disease without esophagitis: Secondary | ICD-10-CM

## 2011-03-29 DIAGNOSIS — M797 Fibromyalgia: Secondary | ICD-10-CM

## 2011-03-29 DIAGNOSIS — G43109 Migraine with aura, not intractable, without status migrainosus: Secondary | ICD-10-CM

## 2011-03-29 DIAGNOSIS — IMO0001 Reserved for inherently not codable concepts without codable children: Secondary | ICD-10-CM

## 2011-03-29 MED ORDER — FLUOXETINE HCL 10 MG PO CAPS: 20.0000 mg | ORAL_CAPSULE | Freq: Every day | ORAL | Status: DC

## 2011-03-29 NOTE — Patient Instructions (Addendum)
Start prozac 10mg  daily for 2 weeks then increase to 2 pills daily. Return in 1 month for follow up. Good to meet you today, call us with questions. Pas by [facility] office for referral to integrative therapies

## 2011-03-29 NOTE — Progress Notes (Addendum)
Subjective:    Patient ID: Christy Shaffer, female    DOB: [birth year], 41 y.o.   MRN: [id number]  HPI CC: new pt establish, prior seen by [facility] clinic  Friend of [place].  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  Sees neurologist Dr. Megan Mans who is treating atypical facial pain, have discussed gamma knife and other surgeries, but states unable to have this procedure done 2/2 atypical nature of pain/neuralgia.  S/p accupuncture with electrodes, lidocaine shots, several other treatments and has not improved.  Recently tapered off klonopin 3 wks ago, felt some withdrawals.  Stopped per neurology.  No tremors, no worsening anxiety, no tachycardia, seizures.  Did have migraines with aura after stopping klonopin.  Dx with fibromyalgia by Dr. Cristal [facility] at [facility] 2012.  Sees Dr. Pricilla Holm for pain management at [facility].  Hopeful to come off pain meds eventually for better overall health.  Feels depression coming back.  Previously on prozac, stopped because didn't think needed anymore.  Now would like to start.  Stays depressed 2/2 frustration about chronic pain.  Thinks may be interested in counseling.  Has counselor in [place] she can call to re establish. No SI/HI.  Notes trouble falling asleep as well.  Prevetative:  Last well woman exam - 2011, normal.  mammograms yearly Last physical - 09/2010.  Medications and allergies reviewed and updated in chart.  Past histories reviewed and updated if relevant as below. Patient Active Problem List  Diagnoses  . DISORDER, DYSTHYMIC  . TRIGEMINAL NEURALGIA  . RECTAL BLEEDING  . UTI  . PREMENSTRUAL SYNDROME  . MENORRHAGIA, PERIMENOPAUSAL  . LUMBAGO  . CERVICAL LYMPHADENOPATHY  . FLANK PAIN, RIGHT  . ABFND, BREAST MAMMOGRAPHIC MICROCALCIFICATION  . DEPRESSION, RECURRENT  . Fibromyalgia    Past Medical History  Diagnosis Date  . Factor V Leiden mutation     trait  . Varicosities   . Trigeminal neuralgia 2001    after dental procedures  . Fibromyalgia   . GERD (gastroesophageal reflux disease)   . History of chicken pox   . Migraines   . History of shingles    Past Surgical History  Procedure Date  . Endometrial ablation 01/14/08  . Hysteroscopy 01/14/08  . Total abdominal hysterectomy 05/10/09    with sling  . Appendectomy   . Tonsillectomy 1983   History  Substance Use Topics  . Smoking status: Former Smoker -- 0.5 packs/day for 10 years    Types: Cigarettes    Quit date: 02/26/1999  . Smokeless tobacco: Never Used  . Alcohol Use: No   Family History  Problem Relation Age of Onset  . Alcohol abuse Mother   . Arthritis Mother   . Hypertension Mother   . Hyperlipidemia Mother   . Diabetes Mother   . Arthritis Maternal Grandmother   . Stroke Maternal Grandmother   . Diabetes Maternal Grandmother   . Diabetes Maternal Grandfather   . Coronary artery disease Neg Hx   . Cancer Neg Hx    Allergies  Allergen Reactions  . Sulfa Antibiotics Diarrhea  . Penicillins Rash    High doses only-can tolerate lower doses   Current Outpatient Prescriptions on File Prior to Visit  Medication Sig Dispense Refill  . baclofen (LIORESAL) 10 MG tablet Take 10 mg by mouth 6 (six) times daily. As needed      . LORazepam (ATIVAN)  2 MG/ML concentrated solution Take 1 mg by mouth every 8 (eight) hours. Pt takes 0.75 @@bedtime        . oxyCODONE (OXYCONTIN) 20 MG 12 hr tablet Take 1 tablet (20 mg total) by mouth 2 (two) times daily as needed for pain.  40 tablet  0  . NAFTIN 1 % GEL        Review of Systems  Constitutional: Positive for chills. Negative for fever, activity change, appetite change, fatigue and unexpected weight change.  HENT: Negative for hearing loss and neck pain.   Eyes: Negative for visual disturbance.  Respiratory: Negative for cough, chest  tightness, shortness of breath and wheezing.   Cardiovascular: Negative for chest pain, palpitations and leg swelling.  Gastrointestinal: Positive for diarrhea and constipation. Negative for nausea, vomiting, abdominal pain, blood in stool and abdominal distention.  Genitourinary: Negative for hematuria and difficulty urinating.  Musculoskeletal: Negative for myalgias and arthralgias.  Skin: Negative for rash.  Neurological: Negative for dizziness, seizures, syncope and headaches.  Hematological: Does not bruise/bleed easily.  Psychiatric/Behavioral: Positive for dysphoric mood. The patient is not nervous/anxious.        Objective:   Physical Exam  Nursing note and vitals reviewed. Constitutional: She is oriented to person, place, and time. She appears well-developed and well-nourished. No distress.  HENT:  Head: Normocephalic and atraumatic.  Right Ear: Hearing, tympanic membrane, external ear and ear canal normal.  Left Ear: Hearing, tympanic membrane, external ear and ear canal normal.  Nose: Nose normal. No mucosal edema or rhinorrhea.  Mouth/Throat: Uvula is midline, oropharynx is clear and moist and mucous membranes are normal. No oropharyngeal exudate, posterior oropharyngeal edema, posterior oropharyngeal erythema or tonsillar abscesses.       Multiple scars upper gumline from recurrent dental surgeries in [place]  Eyes: Conjunctivae and EOM are normal. Pupils are equal, round, and reactive to light. No scleral icterus.  Neck: Normal range of motion. Neck supple. No thyromegaly present.  Cardiovascular: Normal rate, regular rhythm, normal heart sounds and intact distal pulses.   No murmur heard. Pulses:      Radial pulses are 2+ on the right side, and 2+ on the left side.  Pulmonary/Chest: Effort normal and breath sounds normal. No respiratory distress. She has no wheezes. She has no rales.  Abdominal: Soft. Bowel sounds are normal. She exhibits no distension and no mass. There  is no tenderness. There is no rebound and no guarding.  Musculoskeletal: Normal range of motion. She exhibits no edema.  Lymphadenopathy:    She has no cervical adenopathy.  Neurological: She is alert and oriented to person, place, and time.       CN grossly intact, station and gait intact  Skin: Skin is warm and dry. No rash noted.  Psychiatric: Her behavior is normal. Judgment and thought content normal.       Flattened affect      Assessment & Plan:

## 2011-03-30 ENCOUNTER — Encounter: Payer: Self-pay | Admitting: Family Medicine

## 2011-03-30 DIAGNOSIS — K219 Gastro-esophageal reflux disease without esophagitis: Secondary | ICD-10-CM | POA: Insufficient documentation

## 2011-03-30 DIAGNOSIS — G43109 Migraine with aura, not intractable, without status migrainosus: Secondary | ICD-10-CM | POA: Insufficient documentation

## 2011-03-30 NOTE — Assessment & Plan Note (Signed)
On ppi for last 3-4 wks.

## 2011-03-30 NOTE — Assessment & Plan Note (Signed)
Longstanding, now recurrent depression. PHQ9 = 8/27, somewhat difficult to function in day to day life. However pt states when on prozac was doing much better. Restart prozac per pt request, asks we slowly titrate - see pt instructions.

## 2011-03-30 NOTE — Assessment & Plan Note (Signed)
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   Have requested records from [facility] and from Neuro.

## 2011-03-30 NOTE — Assessment & Plan Note (Signed)
Referral to integrative therapies as pt interested in PT, further holistic, comprehensive treatment of FM.  Sees Dr. Dorothy Spark at [facility], will request records.

## 2011-04-01 ENCOUNTER — Telehealth: Payer: Self-pay | Admitting: *Deleted

## 2011-04-01 DIAGNOSIS — G500 Trigeminal neuralgia: Secondary | ICD-10-CM

## 2011-04-01 DIAGNOSIS — M797 Fibromyalgia: Secondary | ICD-10-CM

## 2011-04-01 MED ORDER — FLUOXETINE HCL 20 MG PO CAPS: 20.0000 mg | ORAL_CAPSULE | Freq: Every day | ORAL | Status: DC

## 2011-04-01 NOTE — Telephone Encounter (Signed)
Patient's daughter notified that Rx was sent into pharmacy. She will notify patient and have her call if she has any questions or concerns.

## 2011-04-01 NOTE — Telephone Encounter (Signed)
Patient was seen Friday and was given rx for Prozac for 4 weeks. Her insurance would only allow for 21 days so she had to pay $47. Patient would like the prescription changed to 21 or 90 days because her insurance will pay for either of those. Patient would like the new script sent to [facility]/ [address]. Please let patient know when this is done.

## 2011-04-01 NOTE — Telephone Encounter (Signed)
Sent in 90 d supply of prozac 20mg  daily (sent in higher dose so she won't have to double up on 10mg ).  plz notify.

## 2011-04-07 ENCOUNTER — Encounter: Payer: Self-pay | Admitting: Family Medicine

## 2011-04-26 ENCOUNTER — Ambulatory Visit (INDEPENDENT_AMBULATORY_CARE_PROVIDER_SITE_OTHER): Payer: Private Health Insurance - Indemnity | Admitting: Family Medicine

## 2011-04-26 ENCOUNTER — Encounter: Payer: Self-pay | Admitting: Family Medicine

## 2011-04-26 VITALS — BP 124/74 | HR 92 | Temp 98.2°F | Wt 164.0 lb

## 2011-04-26 DIAGNOSIS — E785 Hyperlipidemia, unspecified: Secondary | ICD-10-CM

## 2011-04-26 DIAGNOSIS — R5381 Other malaise: Secondary | ICD-10-CM

## 2011-04-26 DIAGNOSIS — IMO0001 Reserved for inherently not codable concepts without codable children: Secondary | ICD-10-CM

## 2011-04-26 DIAGNOSIS — M797 Fibromyalgia: Secondary | ICD-10-CM

## 2011-04-26 DIAGNOSIS — K219 Gastro-esophageal reflux disease without esophagitis: Secondary | ICD-10-CM

## 2011-04-26 DIAGNOSIS — R5383 Other fatigue: Secondary | ICD-10-CM

## 2011-04-26 DIAGNOSIS — F329 Major depressive disorder, single episode, unspecified: Secondary | ICD-10-CM

## 2011-04-26 DIAGNOSIS — F3289 Other specified depressive episodes: Secondary | ICD-10-CM

## 2011-04-26 DIAGNOSIS — G501 Atypical facial pain: Secondary | ICD-10-CM

## 2011-04-26 NOTE — Assessment & Plan Note (Signed)
Longstanding, recurrent.   PHQ9 = 8/27 last visit 1 month ago, 8/27 again today.  Overall unchanged. Pt feels prozac helping so will continue.

## 2011-04-26 NOTE — Assessment & Plan Note (Signed)
rec f/u with pain management at [facility] and discuss concerns she's had with them. If pt desires to come off oxycodone, discussed slow taper over weeks as states has had withdrawal sxs in past.

## 2011-04-26 NOTE — Assessment & Plan Note (Signed)
Continue to f/u with integrative therapies.  Appreciate their care of pt.

## 2011-04-26 NOTE — Assessment & Plan Note (Signed)
continue dexilant.  Prescribed by [facility]). Pt feels helping GERD.

## 2011-04-26 NOTE — Progress Notes (Signed)
Subjective:    Patient ID: Christy Shaffer, female    DOB: [birth year], 41 y.o.   MRN: [id number]  HPI CC: 1 mo f/u  Pleasant 41 yo unfortunately with h/o fibromyalgia and atypical facial pain thought from multiple dental procedures in [place] presents for 1 mo f/u.  Established last month.  To f/u with Dr. Lavell Luster and pain management later this month.  Depression - prozac caused HA for the first week, now doing better.  Mood stable.  PHQ 9 administered today.  Pain - overall unchanged.  Had bad experience at last pain clinic visit at [facility].  Not feeling satisfied with care received there.  Hopeful to decrease use of narcotics because feels they are changing her and affecting mood.  Also worried about stigma of narcotic use.  Has been to integrative therapies x3 in last month.  Very pleased with progress and care received there.  Doesn't feel has helped with pain but is learning strategies to better cope.  Feels learning stress management.  Staying fatigued.  Wonders if there's anything else she could take for chronic fatigue.  Has had extensive w/u including lyme titers for fibro.  Medications and allergies reviewed and updated in chart.  Past histories reviewed and updated if relevant as below. Patient Active Problem List  Diagnoses  . Trigeminal neuralgia syndrome, atypical  . PREMENSTRUAL SYNDROME  . ABFND, BREAST MAMMOGRAPHIC MICROCALCIFICATION  . DEPRESSION, RECURRENT  . Fibromyalgia  . Migraine with aura  . GERD (gastroesophageal reflux disease)   Past Medical History  Diagnosis Date  . Factor V Leiden mutation     trait  . Varicosities   . Atypical facial pain 2001    V2 distribution after dental procedures  . Fibromyalgia     Lyme titers neg, extensive w/u  . GERD (gastroesophageal reflux disease)   . History of chicken pox   . Migraine with aura   . History of shingles    Past Surgical History  Procedure Date  . Endometrial ablation 01/14/08  . Hysteroscopy 01/14/08    . Total abdominal hysterectomy 05/10/09    with sling, fibroids and heavy bleeding  . Appendectomy   . Tonsillectomy 1983   History  Substance Use Topics  . Smoking status: Former Smoker -- 0.5 packs/day for 10 years    Types: Cigarettes    Quit date: 02/26/1999  . Smokeless tobacco: Never Used  . Alcohol Use: No   Family History  Problem Relation Age of Onset  . Alcohol abuse Mother   . Arthritis Mother   . Hypertension Mother   . Hyperlipidemia Mother   . Diabetes Mother   . Arthritis Maternal Grandmother   . Stroke Maternal Grandmother   . Diabetes Maternal Grandmother   . Diabetes Maternal Grandfather   . Coronary artery disease Neg Hx   . Cancer Neg Hx    Allergies  Allergen Reactions  . Sulfa Antibiotics Diarrhea  . Penicillins Rash    High doses only-can tolerate lower doses   Current Outpatient Prescriptions on File Prior to Visit  Medication Sig Dispense Refill  . baclofen (LIORESAL) 10 MG tablet Take 10 mg by mouth 2 (two) times daily as needed.       Marland Kitchen FLUoxetine (PROZAC) 20 MG capsule Take 1 capsule (20 mg total) by mouth daily.  90 capsule  1  . SUMAtriptan (IMITREX) 100 MG tablet Take 100 mg by mouth every 2 (two) hours as needed.      . indomethacin (INDOCIN) 50 MG  capsule Take 150 mg by mouth as directed.      . NON FORMULARY as directed. Iberogast-for stomach (iberis amara,angelica,chamomile,caraway fruit, St. Mary's thistle,balm leaves; peppermint leaves; celandine; and licorice root)      . NON FORMULARY as directed. Buscopan 10 mg for IBS (Hyoscamine)        Review of Systems Per HPI    Objective:   Physical Exam  Nursing note and vitals reviewed. Constitutional: She appears well-developed and well-nourished. No distress.  Psychiatric: She has a normal mood and affect.       Tears with discussion of recent treatment at pain clinic       Assessment & Plan:

## 2011-04-26 NOTE — Patient Instructions (Addendum)
I think it's a good idea to try and back off narcotics, I would decrease by 1 pill a week and see how you do. I would follow up with [facility] and discuss your concerns with pain management and narcotics in general. Return in 3 months, a few days prior fasting for blood work and we will check Vit B12 and cholesterol, sugar, etc. Good to see you today, call us with questions.

## 2011-04-26 NOTE — Assessment & Plan Note (Signed)
Check blood work when returns fasting.

## 2011-07-05 IMAGING — US US TRANSVAGINAL NON-OB
1 series · 14 of 24 positions shown · non-contrast
Comparison: 03/27/2009

CLINICAL DATA: Follow up indeterminate left ovarian cystic mass.
Fibroids.

TRANSVAGINAL ULTRASOUND OF PELVIS
TECHNIQUE: Transvaginal ultrasound examination of the pelvis was
performed including evaluation of the uterus, ovaries, adnexal
regions, and pelvic cul-de-sac.

[Series 1: us transvaginal non-ob · 0.17mm/px · 14 of 24 slices shown]
[im 1/24]
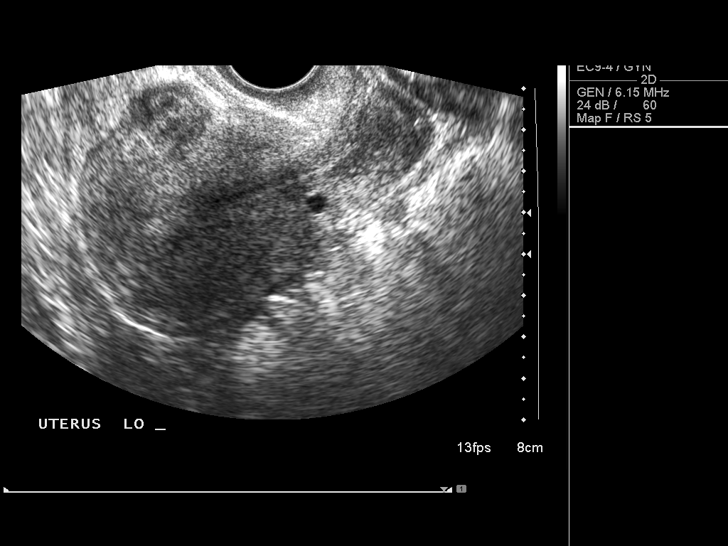
[im 3/24]
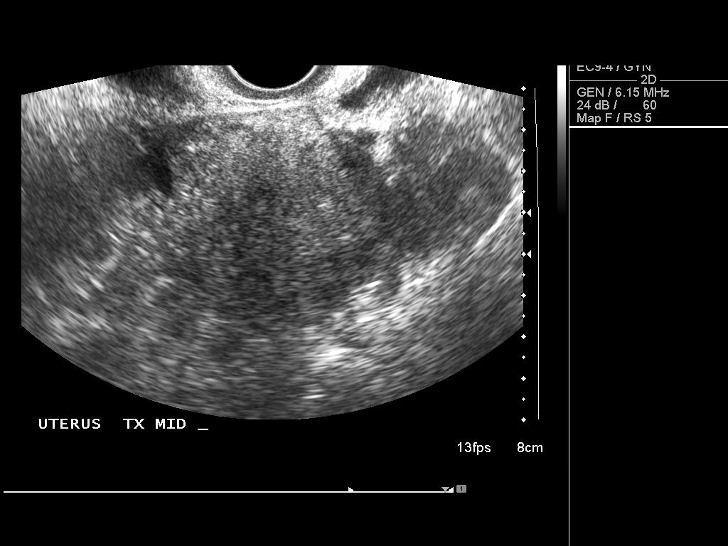
[im 5/24]
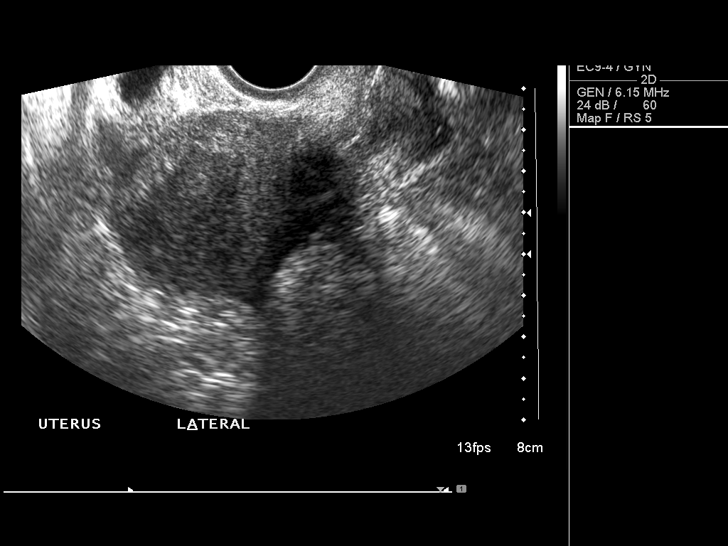
[im 7/24]
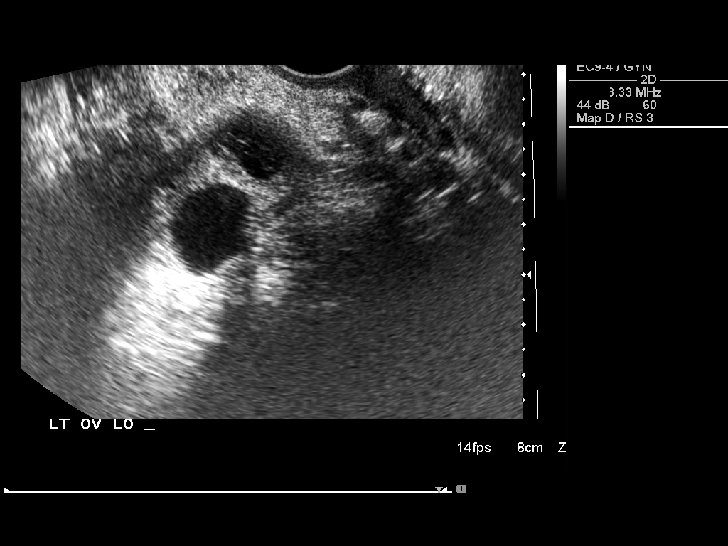
[im 8/24]
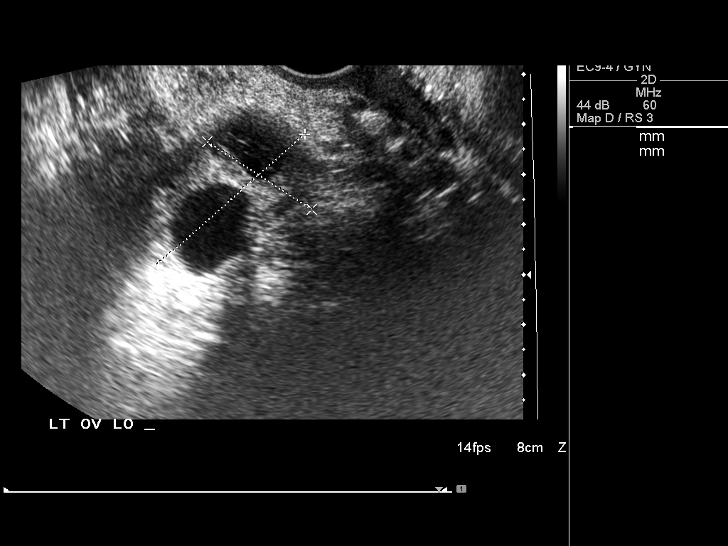
[im 10/24]
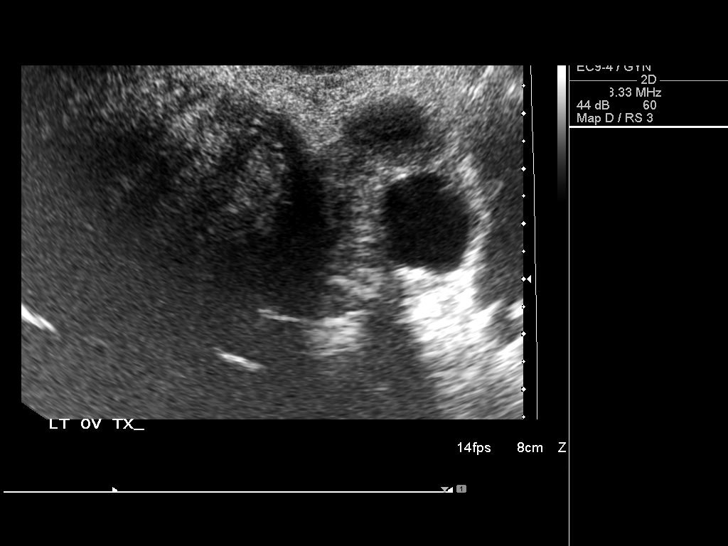
[im 12/24]
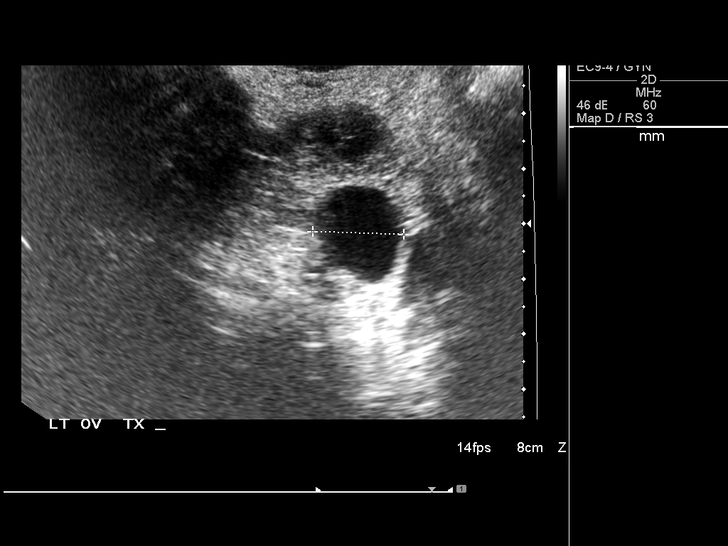
[im 13/24]
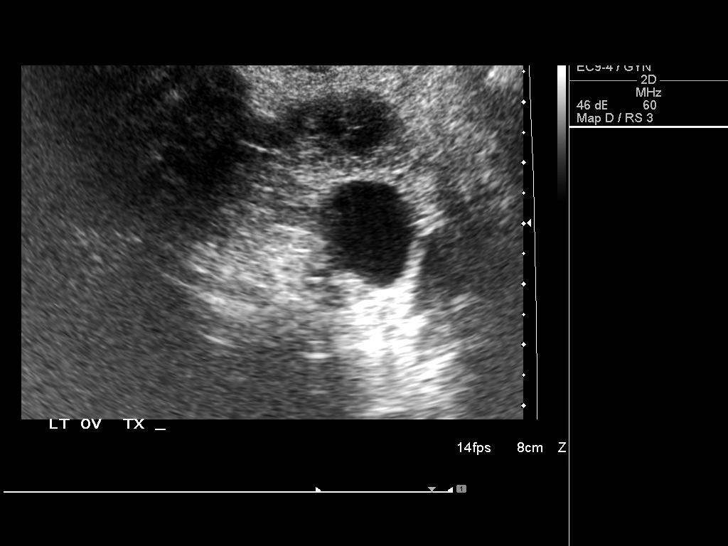
[im 15/24]
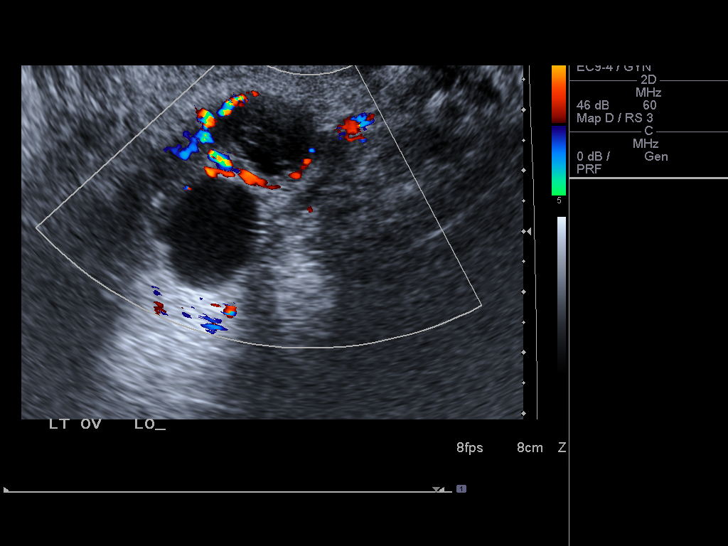
[im 17/24]
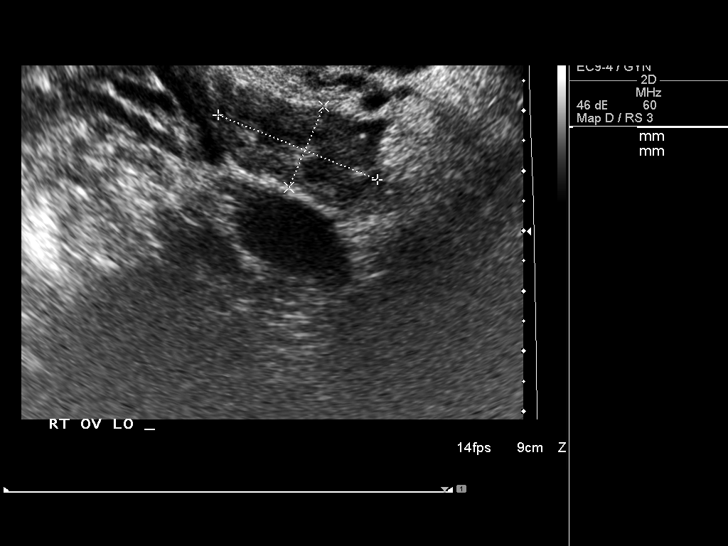
[im 19/24]
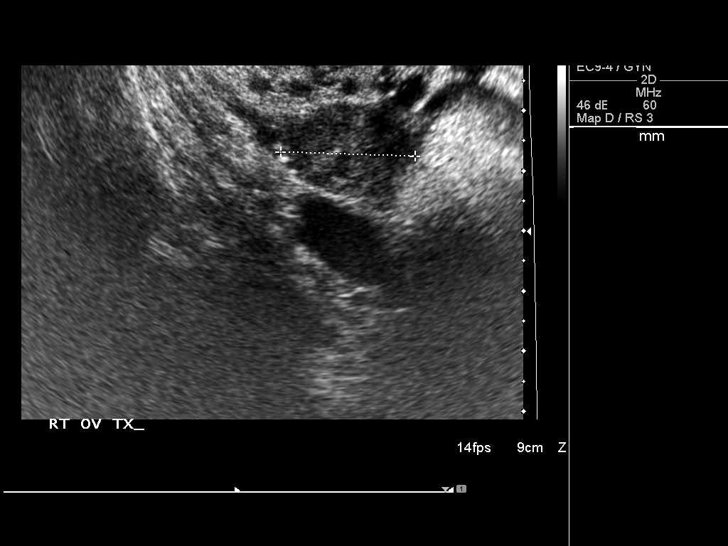
[im 20/24]
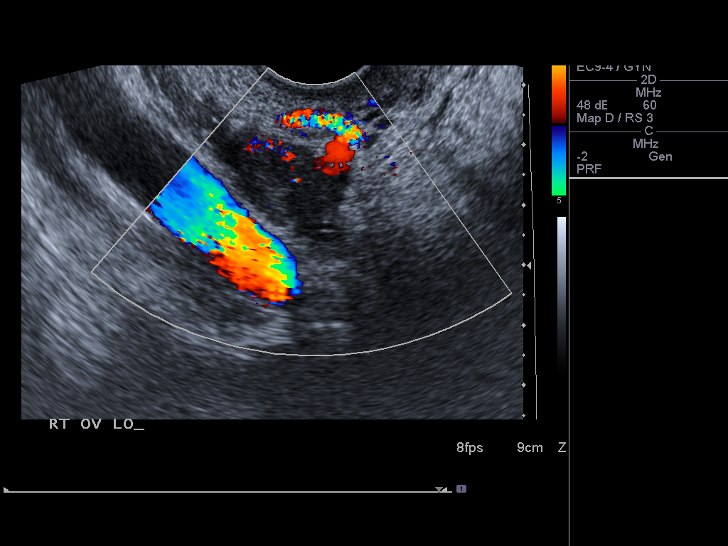
[im 22/24]
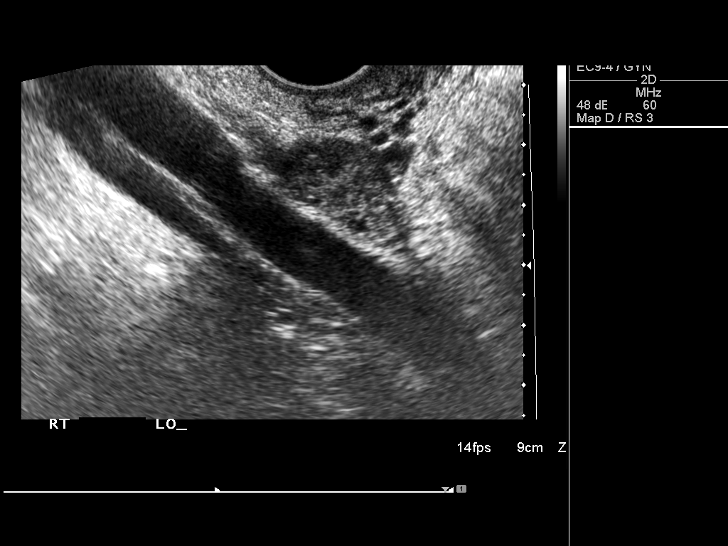
[im 24/24]
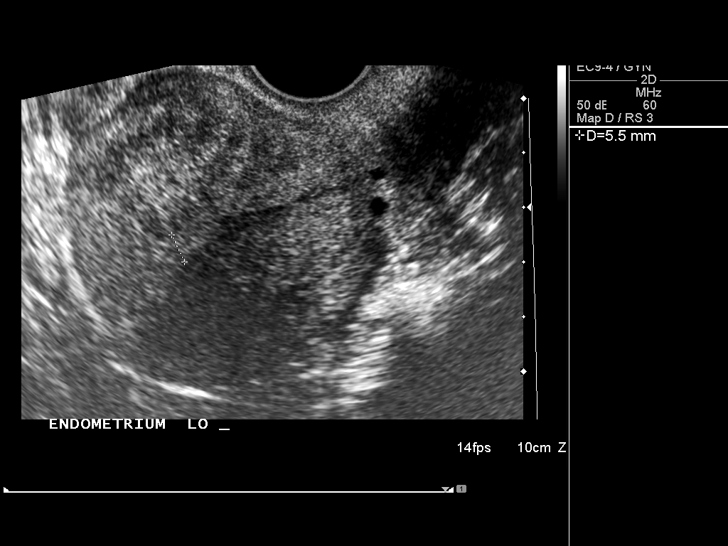

[14 of 24 positions shown; findings below may reference images not displayed]

FINDINGS: Uterus is unchanged in size.  2 cm subserosal fibroid again seen in
the anterior uterine body.

Endometrium measures 6 mm in thickness.  Within normal limits in
appearance.

Right Ovary measures 2.9 x 1.5 x 2.2 cm.  Normal appearance.

Left Ovary measures 3.9 x 2.5 x 3.0 cm.  Normal appearance.
Previously seen cystic lesion with internal septations has resolved
since previous study.  Separate follicles and corpus luteum now
seen measuring 1.5 cm in greatest diameter.

Other Findings:  No adnexal mass or free fluid identified.
IMPRESSION: 1.  Resolution of complex physiologic cyst of the left ovary since
prior study.
2.  Stable 2 cm subserosal fibroid in the anterior uterine body.

## 2011-08-06 ENCOUNTER — Encounter: Payer: Self-pay | Admitting: Family Medicine

## 2011-08-06 ENCOUNTER — Ambulatory Visit (INDEPENDENT_AMBULATORY_CARE_PROVIDER_SITE_OTHER): Payer: Private Health Insurance - Indemnity | Admitting: Family Medicine

## 2011-08-06 ENCOUNTER — Encounter: Payer: Self-pay | Admitting: *Deleted

## 2011-08-06 VITALS — BP 122/78 | HR 83 | Ht 66.0 in | Wt 157.0 lb

## 2011-08-06 DIAGNOSIS — N941 Unspecified dyspareunia: Secondary | ICD-10-CM

## 2011-08-06 DIAGNOSIS — K2960 Other gastritis without bleeding: Secondary | ICD-10-CM

## 2011-08-06 DIAGNOSIS — K5909 Other constipation: Secondary | ICD-10-CM

## 2011-08-06 DIAGNOSIS — Z1211 Encounter for screening for malignant neoplasm of colon: Secondary | ICD-10-CM

## 2011-08-06 DIAGNOSIS — IMO0002 Reserved for concepts with insufficient information to code with codable children: Secondary | ICD-10-CM

## 2011-08-06 DIAGNOSIS — M797 Fibromyalgia: Secondary | ICD-10-CM | POA: Insufficient documentation

## 2011-08-06 DIAGNOSIS — Z0000 Encounter for general adult medical examination without abnormal findings: Secondary | ICD-10-CM

## 2011-08-06 DIAGNOSIS — K5903 Drug induced constipation: Secondary | ICD-10-CM

## 2011-08-06 DIAGNOSIS — G500 Trigeminal neuralgia: Secondary | ICD-10-CM

## 2011-08-06 DIAGNOSIS — Z136 Encounter for screening for cardiovascular disorders: Secondary | ICD-10-CM

## 2011-08-06 LAB — POCT URINALYSIS DIPSTICK
Protein, UA: NEGATIVE
Spec Grav, UA: 1.02
Urobilinogen, UA: 0.2

## 2011-08-06 MED ORDER — LUBIPROSTONE 24 MCG PO CAPS: 24.0000 ug | ORAL_CAPSULE | Freq: Two times a day (BID) | ORAL | Status: DC

## 2011-08-06 MED ORDER — RANITIDINE HCL 300 MG PO TABS: 300.0000 mg | ORAL_TABLET | Freq: Every day | ORAL | Status: DC

## 2011-08-06 NOTE — Patient Instructions (Signed)

## 2011-08-06 NOTE — Progress Notes (Signed)
Subjective:    Patient ID: Christy Shaffer, female    DOB: 04-02-1970, 41 y.o.   MRN: [id number]  HPI Patient's here for a yearly physical. Major concerns #1 dyspareunia. Patient has had a hysterectomy with a urethral/bladder sling that has had major ramifications with her sex life causing pain beginning, throughout, and at the end of sex relations. She has experienced major discomfort. 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  #2 fibromyalgia. Which is so bad she wants to know if she has to have a mammogram this year which she has dreaded since last year. She states that she thought her mammogram was fine last year. #3 trigeminal neuralgia on the right. She is on multiple opiate medication for the recurrent pain. #4 recurrent constiintlpation/obstipation. She was given an Amitiza prescription which she did not take long-term because hex did not relieve constipation right away. #5 recurrent reflux. She states despite taking Dexilant.   Review of Systems  Constitutional: Positive for fatigue.  Gastrointestinal: Positive for constipation.  Genitourinary: Positive for menstrual problem, pelvic pain and dyspareunia.  Musculoskeletal: Positive for myalgias, back pain and arthralgias.      BP 122/78  Pulse 83  Ht 5\' 6"  (1.676 m)  Wt 157 lb (71.215 kg)  BMI 25.34 kg/m2  SpO2 96% Objective:   Physical Exam  Vitals reviewed. Constitutional: She is oriented to person, place, and time. She appears well-developed and well-nourished.  HENT:  Head: Normocephalic and atraumatic.  Right Ear: Tympanic membrane and external ear normal.  Left Ear: Tympanic membrane and external ear normal.       Excessive wax in the right ear which was removed using current scar present upper gums which she's had multiple surgeries as a child  Eyes: Pupils are equal, round, and reactive to light.  Fundoscopic exam:   The right eye shows no arteriolar narrowing and no AV nicking.       The left eye shows no arteriolar narrowing and no AV nicking.  Neck: Normal range of motion. Neck supple. No tracheal deviation present. No thyromegaly present.  Cardiovascular: Normal rate and regular rhythm.  Exam reveals no gallop and no friction rub.   No murmur heard. Pulmonary/Chest: Breath sounds normal. No respiratory distress. She has no wheezes.  Abdominal: Soft. Bowel sounds are normal. She exhibits no mass. There is no tenderness. There is no rebound and no guarding. Hernia confirmed negative in the right inguinal area and confirmed negative in the left inguinal area.  Genitourinary: Rectal exam shows tenderness. Rectal exam shows no fissure and no mass. Guaiac negative stool. No breast swelling, tenderness, discharge or bleeding. There is no rash, tenderness or lesion on the right labia. There is no rash, tenderness or lesion on the left labia. Right adnexum displays no mass. Left adnexum displays no mass. There is tenderness around the vagina.       Anterior bulge is present which according to her correlates with the vaginal mesh which causes discomfort with sex relations for her and her husband.  Musculoskeletal: She exhibits tenderness. She exhibits no edema.       Diffuse tenderness to palpation  Lymphadenopathy:    She has no cervical adenopathy.       Right: No inguinal adenopathy present.       Left: No inguinal adenopathy present.  Neurological: She is alert and oriented to person, place, and time.  Skin: Skin  is warm and dry. Rash noted. No erythema.  Psychiatric: Her mood appears not anxious. Her affect is not labile. Cognition and memory are not impaired. She exhibits a depressed mood.       Often expresses feelings of frustration with her life.   Results for orders placed in visit on 08/06/11  POCT URINALYSIS DIPSTICK      Component Value Range   Color, UA yellow     Clarity, UA clear     Glucose,  UA neg     Bilirubin, UA neg     Ketones, UA neg     Spec Grav, UA 1.020     Blood, UA trace-intact     pH, UA 6.5     Protein, UA neg     Urobilinogen, UA 0.2     Nitrite, UA neg     Leukocytes, UA Negative     Hemoccult negative EKG normal sinus rhythm.    Assessment & Plan:  #1        health maintenance. Will hold off on mammogram this year at her request #2 reflux. With the dxilant will add Zantac this which he can take at night  #3 constipation. She can still take her branded MiraLAX (Purlex) but I do strongly suggest she takes the Amitiza to help her prevent constipation. #4 severe dyspareunia. Suggest first of all she tried working with Kegel exercises see if that will help raise the mesh. Use generous amounts of a water-base lubricant and have her husband utilize condom to prevent burning with ejaculation due to the vaginal atrophy. We'll also evaluate Premarin vaginal cream in use with patient's with factor V mutation.   Return in 2 months for followup.

## 2011-08-07 ENCOUNTER — Encounter: Payer: Self-pay | Admitting: *Deleted

## 2011-08-07 LAB — CBC WITH DIFFERENTIAL/PLATELET
Basophils Absolute: 0 10*3/uL (ref 0.0–0.1)
HCT: 41.2 % (ref 36.0–46.0)
Hemoglobin: 13.8 g/dL (ref 12.0–15.0)
Lymphocytes Relative: 27 % (ref 12–46)
Monocytes Absolute: 0.4 10*3/uL (ref 0.1–1.0)
Monocytes Relative: 5 % (ref 3–12)
Neutro Abs: 5.9 10*3/uL (ref 1.7–7.7)
RBC: 4.67 MIL/uL (ref 3.87–5.11)
WBC: 8.8 10*3/uL (ref 4.0–10.5)

## 2011-08-07 LAB — LIPID PANEL
HDL: 48 mg/dL (ref >39)
LDL Cholesterol: 139 mg/dL — ABNORMAL HIGH (ref 0–99)
Triglycerides: 68 mg/dL (ref <150)
VLDL: 14 mg/dL (ref 0–40)

## 2011-08-07 LAB — COMPLETE METABOLIC PANEL WITH GFR
Alkaline Phosphatase: 37 U/L — ABNORMAL LOW (ref 39–117)
BUN: 12 mg/dL (ref 6–23)
GFR, Est Non African American: >89 mL/min
Glucose, Bld: 105 mg/dL — ABNORMAL HIGH (ref 70–99)
Total Bilirubin: 0.6 mg/dL (ref 0.3–1.2)

## 2011-08-07 LAB — VITAMIN B12: Vitamin B-12: 897 pg/mL (ref 211–911)

## 2011-08-07 LAB — TSH: TSH: 2.442 u[IU]/mL (ref 0.350–4.500)

## 2011-08-08 ENCOUNTER — Encounter: Payer: Self-pay | Admitting: Family Medicine

## 2011-09-24 ENCOUNTER — Ambulatory Visit (INDEPENDENT_AMBULATORY_CARE_PROVIDER_SITE_OTHER): Payer: Private Health Insurance - Indemnity | Admitting: Family Medicine

## 2011-09-24 ENCOUNTER — Encounter: Payer: Self-pay | Admitting: Family Medicine

## 2011-09-24 VITALS — BP 131/82 | HR 92 | Ht 66.0 in | Wt 155.0 lb

## 2011-09-24 DIAGNOSIS — G43109 Migraine with aura, not intractable, without status migrainosus: Secondary | ICD-10-CM

## 2011-09-24 DIAGNOSIS — K2100 Gastro-esophageal reflux disease with esophagitis, without bleeding: Secondary | ICD-10-CM

## 2011-09-24 DIAGNOSIS — M797 Fibromyalgia: Secondary | ICD-10-CM

## 2011-09-24 DIAGNOSIS — IMO0001 Reserved for inherently not codable concepts without codable children: Secondary | ICD-10-CM

## 2011-09-24 MED ORDER — MELOXICAM 7.5 MG PO TABS: 7.5000 mg | ORAL_TABLET | Freq: Every day | ORAL | Status: AC

## 2011-09-24 MED ORDER — KETOROLAC TROMETHAMINE 60 MG/2ML IM SOLN
60.0000 mg | Freq: Once | INTRAMUSCULAR | Status: AC
  Administered 2011-09-24: 60 mg via INTRAMUSCULAR

## 2011-09-24 MED ORDER — METHYLPREDNISOLONE SODIUM SUCC 125 MG IJ SOLR
125.0000 mg | Freq: Once | INTRAMUSCULAR | Status: AC
  Administered 2011-09-24: 125 mg via INTRAMUSCULAR

## 2011-09-24 MED ORDER — SUCRALFATE 1 G PO TABS: 2.0000 g | ORAL_TABLET | Freq: Two times a day (BID) | ORAL | Status: DC

## 2011-09-24 NOTE — Progress Notes (Signed)
Subjective:    Patient ID: Christy Shaffer, female    DOB: 06-15-1970, 41 y.o.   MRN: [id number]  HPI  The patient is here for several issues. #1 migraines. She states she's had a migraine now for over 3 days the Imitrex is not doing the usual job and she is miserable. She has been taking Motrin 800 mg but the concern of course is that the Motrin causes GI irritation. She knows not to take it with Indocin since that will only make things worse.   #2 fibromyalgia. Prednisone by mouth has seemed to help with the fibromyalgia. Her neurologist suggested that she take 3 doses of steroids 75 mg, 50 mg, and then 25 mg daily over 3 days to try to reduce reflux from occurring. #3 clotting factor disorder. She has had numbness and tingling of both hands and fingers and she is concerned that she may need some type of anti-clotting medication to keep from having a stroke. Her grandmother who also had a clotting disorder experienced multiple mini strokes. Right now she is on no blood thinners.   #4 vaginal atrophy /vaginal irritation status post bladder sling    Review of Systems BP 131/82  Pulse 92  Ht 5\' 6"  (1.676 m)  Wt 155 lb (70.308 kg)  BMI 25.02 kg/m2  SpO2 96%    Objective:   Physical Exam  Vitals reviewed. Constitutional: She is oriented to person, place, and time. She appears well-developed and well-nourished. She appears distressed.       Crying at times  HENT:  Head: Normocephalic.  Neurological: She is alert and oriented to person, place, and time.  Skin: Skin is warm and dry.  Psychiatric: Her mood appears anxious. She exhibits a depressed mood.       Assessment & Plan:     #1   #1  and #2 migraine/fibromyalgia pain. we will try to simplify some of the issues today.  We will give a shot of Toradol 60 mg IM to see if will help the migraine.  Solu-Medrol 125 IM today. Tomorrow if the migraine is still present she may receive one more shot of Toradol 60 mg but no Motrin by  mouth. Starting on Wednesday if she does not get the second shot Mobic 7.5 mg one tablet a day for pain management system Mobic will be less GI irritation which is important for her history of reflux. Hopefully the Toradol injection will help eradicate her migraine that she has now.  #3 we will place her on a baby aspirin one tablet a day to try to be proactive in reducing stroke risk. I have reiterated as the neurologist did that the numbness and tingling in her hands that occur when she is having a migraine is secondary to the vasospasm of the migraine. I did admit that people have had strokes while having severe migraines but that is not the norm. The best thing that she can do is to try to avoid migraines by taking her medication.  #4 she's made clear that she does not want to take the risk of having the increased chance of strokes by taking estrogen vaginal cream. Support her decision since this is truly her call. I did state that I would not undergo surgery to avoid the next as was suggested by her neurologist. I understand he is concerned that any foreign objects in the body may aggravate her fibromyalgia I am concerned that any surgery might leave her with increase scar tissue and cause  worsening vaginal irritation.  Return in a month for further and reevaluation.

## 2011-09-24 NOTE — Patient Instructions (Signed)
Start taking the mobic as needed but not with the baby asprin. Take the mobic as needed for migraines and general pain but do not take with Motrin or Indocin. Carafate 2 g by mouth twice a day on an empty stomach for one to 2 months.

## 2011-09-25 ENCOUNTER — Ambulatory Visit (INDEPENDENT_AMBULATORY_CARE_PROVIDER_SITE_OTHER): Payer: Private Health Insurance - Indemnity | Admitting: Physician Assistant

## 2011-09-25 DIAGNOSIS — R51 Headache: Secondary | ICD-10-CM

## 2011-09-25 MED ORDER — KETOROLAC TROMETHAMINE 60 MG/2ML IM SOLN
60.0000 mg | Freq: Once | INTRAMUSCULAR | Status: AC
  Administered 2011-09-25: 60 mg via INTRAMUSCULAR

## 2011-09-25 MED ORDER — METHYLPREDNISOLONE SODIUM SUCC 40 MG IJ SOLR
80.0000 mg | Freq: Once | INTRAMUSCULAR | Status: AC
  Administered 2011-09-25: 80 mg via INTRAMUSCULAR

## 2011-09-25 NOTE — Progress Notes (Signed)
  Subjective:    Patient ID: Christy Shaffer, female    DOB: 12-Aug-1970, 41 y.o.   MRN: [id number]  HPI pt returns for 2nd toradol injection and 80mg  solu-medrol as instructed by Dr. Thurmond Butts    Review of Systems     Objective:   Physical Exam        Assessment & Plan:  Toradol and Solumedrol were given IM today. No complications. Still has migraine. Will come in tomorrow for last dose of solu-medrol 60mg . Call if migraine not resolved. Tandy Gaw PA-C

## 2011-09-26 ENCOUNTER — Ambulatory Visit: Payer: Private Health Insurance - Indemnity

## 2011-09-26 ENCOUNTER — Telehealth: Payer: Self-pay | Admitting: *Deleted

## 2011-09-26 ENCOUNTER — Ambulatory Visit (INDEPENDENT_AMBULATORY_CARE_PROVIDER_SITE_OTHER): Payer: Private Health Insurance - Indemnity | Admitting: Family Medicine

## 2011-09-26 ENCOUNTER — Encounter: Payer: Self-pay | Admitting: Family Medicine

## 2011-09-26 VITALS — BP 133/80 | HR 93

## 2011-09-26 DIAGNOSIS — G43909 Migraine, unspecified, not intractable, without status migrainosus: Secondary | ICD-10-CM

## 2011-09-26 DIAGNOSIS — M797 Fibromyalgia: Secondary | ICD-10-CM

## 2011-09-26 MED ORDER — KETOROLAC TROMETHAMINE 60 MG/2ML IM SOLN
60.0000 mg | Freq: Once | INTRAMUSCULAR | Status: AC
  Administered 2011-09-26: 60 mg via INTRAMUSCULAR

## 2011-09-26 MED ORDER — METHYLPREDNISOLONE SODIUM SUCC 40 MG IJ SOLR
60.0000 mg | Freq: Once | INTRAMUSCULAR | Status: AC
  Administered 2011-09-26: 60 mg via INTRAMUSCULAR

## 2011-09-26 NOTE — Patient Instructions (Addendum)
none

## 2011-09-26 NOTE — Progress Notes (Signed)
  Subjective:    Patient ID: Christy Shaffer, female    DOB: 28-Jan-1971, 41 y.o.   MRN: [id number] Solumedrol and Toradol injections. Pt states she still has HA today. HPI    Review of Systems     Objective:   Physical Exam        Assessment & Plan:  Patient not seen by physician today but treated with the third round of Solu-Medrol and Toradol for her migraines and fibromyalgia discomfort.

## 2011-09-26 NOTE — Telephone Encounter (Signed)
Spoke w/ Dr. Thurmond Butts- Pt is to come in today for NV. She is to have Solu-medrol 60mg  and if still having HA she can also have 60mg  of toradol. I will call Pt to inform her of this and I will schedule Pt for NV.

## 2011-09-26 NOTE — Telephone Encounter (Signed)
I called pt and she states that she was feeling better today and that if her HA got worse she would call back. I informed pt that she would need to schedule an OV to be seen. NV for this am cancelled.

## 2011-09-26 NOTE — Telephone Encounter (Signed)
Evidently Dr. Thurmond Butts has spoken with Neuro and they wanted her to get Solu-medrol injection x 3 days in a row.  OK for nurse visit.

## 2011-09-26 NOTE — Telephone Encounter (Signed)
Can you look at the 2 previous visits and clarify if pt should be getting Solu-Medrol again today. At first visit on 09/24/11 pt received solumedrol and toradol. Per what I read from office note on 09/24/11, pt could come get another Toradol 60mg  if she still had migraine on Wednesday. Pt received toradol and Solumedrol yesterday at NV. Pt is scheduled to come in today to receive another Solu-medrol injection. Please advise what we need to do.

## 2011-10-29 ENCOUNTER — Telehealth: Payer: Self-pay | Admitting: *Deleted

## 2011-10-29 DIAGNOSIS — R300 Dysuria: Secondary | ICD-10-CM

## 2011-10-29 DIAGNOSIS — Z9889 Other specified postprocedural states: Secondary | ICD-10-CM

## 2011-10-29 NOTE — Telephone Encounter (Signed)
Pt has called requesting a referral to urology. States every since she had the mesh placed that she has had multiple UTI's and states she has to "push" to void. Please advise.

## 2011-10-29 NOTE — Telephone Encounter (Signed)
Referral placed per Dr. Thurmond Butts.

## 2011-10-29 NOTE — Telephone Encounter (Signed)
We would be glad to refer her to a urologist. If she has one she prefers please let us know and we will course refer to that one could not please find out if she would prefer local urologist, [facility] or [facility].

## 2011-12-10 ENCOUNTER — Ambulatory Visit (INDEPENDENT_AMBULATORY_CARE_PROVIDER_SITE_OTHER): Payer: Private Health Insurance - Indemnity | Admitting: Family Medicine

## 2011-12-10 ENCOUNTER — Encounter: Payer: Self-pay | Admitting: Family Medicine

## 2011-12-10 VITALS — BP 126/83 | HR 80 | Temp 98.0°F | Ht 66.0 in | Wt 154.0 lb

## 2011-12-10 DIAGNOSIS — M797 Fibromyalgia: Secondary | ICD-10-CM

## 2011-12-10 DIAGNOSIS — IMO0001 Reserved for inherently not codable concepts without codable children: Secondary | ICD-10-CM

## 2011-12-10 DIAGNOSIS — Z23 Encounter for immunization: Secondary | ICD-10-CM

## 2011-12-10 DIAGNOSIS — J019 Acute sinusitis, unspecified: Secondary | ICD-10-CM

## 2011-12-10 MED ORDER — FEXOFENADINE-PSEUDOEPHED ER 180-240 MG PO TB24: 1.0000 | ORAL_TABLET | Freq: Every day | ORAL | Status: DC

## 2011-12-10 MED ORDER — KETOROLAC TROMETHAMINE 60 MG/2ML IM SOLN
60.0000 mg | Freq: Once | INTRAMUSCULAR | Status: AC
  Administered 2011-12-10: 60 mg via INTRAMUSCULAR

## 2011-12-10 MED ORDER — CEFUROXIME AXETIL 500 MG PO TABS: 500.0000 mg | ORAL_TABLET | Freq: Two times a day (BID) | ORAL | Status: DC

## 2011-12-10 MED ORDER — SODIUM CHLORIDE 0.9 % IV SOLN
125.0000 mg | Freq: Every day | INTRAVENOUS | Status: DC
  Administered 2011-12-10: 130 mg via INTRAMUSCULAR

## 2011-12-10 MED ORDER — FLUTICASONE PROPIONATE 50 MCG/ACT NA SUSP: 2.0000 | Freq: Every day | NASAL | Status: DC

## 2011-12-10 NOTE — Progress Notes (Signed)
Subjective:    Patient ID: Christy Shaffer, female    DOB: [birth year], 41 y.o.   MRN: [id number]  Sinusitis This is a new problem. The current episode started 1 to 4 weeks ago (about 10 days ago cold like symptoms). The problem has been gradually worsening since onset. There has been no fever. Pain scale: exacerbation of facial and body fibromyalgia. The pain is moderate. Associated symptoms include chills, congestion, coughing, ear pain, headaches, neck pain, sinus pressure and sneezing. Pertinent negatives include no diaphoresis, hoarse voice, shortness of breath, sore throat or swollen glands. (Neck pain) Past treatments include nothing. The treatment provided no relief.   #2 fibromyalgia. Patient has had a rough time with her fibromyalgia. She is to undergo lidocaine injection IV for pain control. I'm not familiar with this technique. She also has sought out GYN evaluation to see if she should have her vaginal mesh removed. States that the fibromyalgia occurred a few months after having the mesh put in. She's been told by her fibromyalgia physician/specialist that trauma can bring fibromyalgia on and she feels strongly that was after her surgery that her fibromyalgia is started. She wants to know if I think that having the mesh removed is going to help her fibromyalgia. #3 immunization due  Review of Systems  Constitutional: Positive for chills. Negative for diaphoresis.  HENT: Positive for ear pain, congestion, sneezing, neck pain and sinus pressure. Negative for sore throat and hoarse voice.   Respiratory: Positive for cough. Negative for shortness of breath.   Neurological: Positive for headaches.  All other systems reviewed and are negative.      BP 126/83  Pulse 80  Temp 98 F (36.7 C) (Oral)  Ht 5\' 6"  (1.676 m)  Wt 154 lb (69.854 kg)  BMI 24.86 kg/m2  SpO2 98% Objective:   Physical Exam  Nursing note and vitals reviewed. Constitutional: She is oriented to person, place, and time.  She appears well-developed and well-nourished. No distress.  HENT:  Head: Normocephalic and atraumatic.  Right Ear: Tympanic membrane, external ear and ear canal normal.  Left Ear: Tympanic membrane, external ear and ear canal normal.  Nose: Mucosal edema and rhinorrhea present. Right sinus exhibits maxillary sinus tenderness. Left sinus exhibits maxillary sinus tenderness.  Mouth/Throat: Oropharynx is clear and moist. No oral lesions. No oropharyngeal exudate.  Eyes: Right eye exhibits no discharge. Left eye exhibits no discharge. No scleral icterus.  Neck: Neck supple. Muscular tenderness present. No tracheal deviation present.  Cardiovascular: Normal rate, regular rhythm and normal heart sounds.   Pulmonary/Chest: Effort normal and breath sounds normal. She has no wheezes. She has no rales.  Musculoskeletal: She exhibits tenderness.  Lymphadenopathy:    She has cervical adenopathy.  Neurological: She is alert and oriented to person, place, and time.  Skin: Skin is warm and dry.   Should be noted patient has marked tenderness along the neck and also the trapezius muscle    Assessment & Plan:  #1 sinusitis. We'll place on Ceftin, Flonase nasal spray, and Allegra-D.  #2 fibromyalgia exacerbation. I've offered patient an injection of Solu-Medrol and Toradol for the fibromyalgia while she is here and she is going to take it. Explained to her that while I am concerned about the neck stiffness I do not think this is coming from the sinusitis/meningitis. Explained to her that the neck pain includes both trapezius muscles along the shoulder as well also she exhibits no signs of fever at this time. As far as recommending removal  of the mesh that will have to be her call which she might reject it more as an option if lidocaine injections work.  #3 Flu vaccination offered patient declined.  Return in 2 months followup.

## 2011-12-10 NOTE — Patient Instructions (Signed)

## 2011-12-20 ENCOUNTER — Other Ambulatory Visit: Payer: Self-pay | Admitting: *Deleted

## 2011-12-20 MED ORDER — SUCRALFATE 1 G PO TABS: 2.0000 g | ORAL_TABLET | Freq: Two times a day (BID) | ORAL | Status: DC

## 2011-12-25 ENCOUNTER — Telehealth: Payer: Self-pay | Admitting: *Deleted

## 2011-12-25 NOTE — Telephone Encounter (Signed)
Pt calls and states the med you gave her for her ulcer (carafate ?) works and would like a 3 mth supply sent to her pharmacy for cost reasons. Also the Ceftin pill you gave her she can not swallow it its too big and request that a liquid form be sent to her pharmacy. Thirdly she was given Dexilant 60mg  to take daily by her GI at [facility] and needs a refill and would like you to take this over and fill for 90 day supply as well because cost too much to go see GI specialists every time she needs refill on this.

## 2011-12-26 MED ORDER — DEXLANSOPRAZOLE 60 MG PO CPDR: 60.0000 mg | DELAYED_RELEASE_CAPSULE | Freq: Every day | ORAL | Status: DC

## 2011-12-26 MED ORDER — SUCRALFATE 1 G PO TABS: 2.0000 g | ORAL_TABLET | Freq: Two times a day (BID) | ORAL | Status: DC

## 2011-12-26 MED ORDER — CEFUROXIME AXETIL 250 MG/5ML PO SUSR: 500.0000 mg | Freq: Two times a day (BID) | ORAL | Status: DC

## 2011-12-26 NOTE — Telephone Encounter (Signed)
Fine for all three.

## 2011-12-31 ENCOUNTER — Other Ambulatory Visit: Payer: Self-pay | Admitting: Family Medicine

## 2011-12-31 NOTE — Telephone Encounter (Signed)
I have to agree with Christy Shaffer I don't think we ever prescribed this medication. She told us about this medication. If she can state that she's been on this medication for more than once a year and why she wants me to start prescribing the medication then we can fill it.

## 2011-12-31 NOTE — Telephone Encounter (Signed)
IS this ok to refill? I didn't see where we ever rx'ed this med for her

## 2012-01-02 NOTE — Telephone Encounter (Signed)
Since she's been able to document dosing and where she got the medication from she may have this prescription sent in.

## 2012-02-13 ENCOUNTER — Ambulatory Visit (INDEPENDENT_AMBULATORY_CARE_PROVIDER_SITE_OTHER): Payer: Private Health Insurance - Indemnity | Admitting: Family Medicine

## 2012-02-13 ENCOUNTER — Encounter: Payer: Self-pay | Admitting: Family Medicine

## 2012-02-13 VITALS — BP 116/69 | HR 77 | Ht 67.0 in | Wt 155.0 lb

## 2012-02-13 DIAGNOSIS — K219 Gastro-esophageal reflux disease without esophagitis: Secondary | ICD-10-CM

## 2012-02-13 DIAGNOSIS — M797 Fibromyalgia: Secondary | ICD-10-CM

## 2012-02-13 DIAGNOSIS — G43909 Migraine, unspecified, not intractable, without status migrainosus: Secondary | ICD-10-CM

## 2012-02-13 DIAGNOSIS — IMO0001 Reserved for inherently not codable concepts without codable children: Secondary | ICD-10-CM

## 2012-02-13 DIAGNOSIS — G500 Trigeminal neuralgia: Secondary | ICD-10-CM

## 2012-02-13 MED ORDER — ONDANSETRON 8 MG PO TBDP: 8.0000 mg | ORAL_TABLET | Freq: Three times a day (TID) | ORAL | Status: DC | PRN

## 2012-02-13 MED ORDER — RANITIDINE HCL 300 MG PO TABS: 300.0000 mg | ORAL_TABLET | Freq: Every day | ORAL | Status: DC

## 2012-02-13 MED ORDER — SUMATRIPTAN SUCCINATE 6 MG/0.5ML ~~LOC~~ SOLN
6.0000 mg | Freq: Once | SUBCUTANEOUS | Status: AC
  Administered 2012-02-13: 6 mg via SUBCUTANEOUS

## 2012-02-13 MED ORDER — KETOROLAC TROMETHAMINE 60 MG/2ML IM SOLN
60.0000 mg | Freq: Once | INTRAMUSCULAR | Status: AC
  Administered 2012-02-13: 60 mg via INTRAMUSCULAR

## 2012-02-13 MED ORDER — SUMATRIPTAN SUCCINATE 100 MG PO TABS: 100.0000 mg | ORAL_TABLET | ORAL | Status: DC | PRN

## 2012-02-13 MED ORDER — SUCRALFATE 1 G PO TABS: 2.0000 g | ORAL_TABLET | Freq: Two times a day (BID) | ORAL | Status: DC

## 2012-02-13 MED ORDER — METHYLPREDNISOLONE SODIUM SUCC 125 MG IJ SOLR
125.0000 mg | Freq: Once | INTRAMUSCULAR | Status: AC
  Administered 2012-02-13: 125 mg via INTRAMUSCULAR

## 2012-02-13 NOTE — Patient Instructions (Signed)
Migraine Headache A migraine headache is an intense, throbbing pain on one or both sides of your head. A migraine can last for 30 minutes to several hours. CAUSES  The exact cause of a migraine headache is not always known. However, a migraine may be caused when nerves in the brain become irritated and release chemicals that cause inflammation. This causes pain. SYMPTOMS  Pain on one or both sides of your head.  Pulsating or throbbing pain.  Severe pain that prevents daily activities.  Pain that is aggravated by any physical activity.  Nausea, vomiting, or both.  Dizziness.  Pain with exposure to bright lights, loud noises, or activity.  General sensitivity to bright lights, loud noises, or smells. Before you get a migraine, you may get warning signs that a migraine is coming (aura). An aura may include:  Seeing flashing lights.  Seeing bright spots, halos, or zig-zag lines.  Having tunnel vision or blurred vision.  Having feelings of numbness or tingling.  Having trouble talking.  Having muscle weakness. MIGRAINE TRIGGERS  Alcohol.  Smoking.  Stress.  Menstruation.  Aged cheeses.  Foods or drinks that contain nitrates, glutamate, aspartame, or tyramine.  Lack of sleep.  Chocolate.  Caffeine.  Hunger.  Physical exertion.  Fatigue.  Medicines used to treat chest pain (nitroglycerine), birth control pills, estrogen, and some blood pressure medicines. DIAGNOSIS  A migraine headache is often diagnosed based on:  Symptoms.  Physical examination.  A CT scan or MRI of your head. TREATMENT Medicines may be given for pain and nausea. Medicines can also be given to help prevent recurrent migraines.  HOME CARE INSTRUCTIONS  Only take over-the-counter or prescription medicines for pain or discomfort as directed by your caregiver. The use of long-term narcotics is not recommended.  Lie down in a dark, quiet room when you have a migraine.  Keep a journal  to find out what may trigger your migraine headaches. For example, write down:  What you eat and drink.  How much sleep you get.  Any change to your diet or medicines.  Limit alcohol consumption.  Quit smoking if you smoke.  Get 7 to 9 hours of sleep, or as recommended by your caregiver.  Limit stress.  Keep lights dim if bright lights bother you and make your migraines worse. SEEK IMMEDIATE MEDICAL CARE IF:   Your migraine becomes severe.  You have a fever.  You have a stiff neck.  You have vision loss.  You have muscular weakness or loss of muscle control.  You start losing your balance or have trouble walking.  You feel faint or pass out.  You have severe symptoms that are different from your first symptoms. MAKE SURE YOU:   Understand these instructions.  Will watch your condition.  Will get help right away if you are not doing well or get worse. Document Released: 02/11/2005 Document Revised: 05/06/2011 Document Reviewed: 02/01/2011 [employer] Patient Information 2013 [employer].  

## 2012-02-13 NOTE — Progress Notes (Signed)
  Subjective:    Patient ID: Christy Shaffer, female    DOB: 1970-10-13, 41 y.o.   MRN: [id number]  HPI #1 fibromyalgia. Patient has seen both her neurologist and her fibromyalgia doctors. There is some question whether the the bladder sling/mesh initiated or help precipitate her fibromyalgia. She has decided not to have this sling removed since dust about a function, there is no guarantee that if the sling is removed her fibromyalgia would recess or regress, and this is an extensive surgery with his own risks and morbidity. She is asking for my own opinion and knee towards the mesh being important part of causing her fibromyalgia but there is no proof and I have to also agree with her decision not to be aggressive and not being aggressive to remove the mesh.  #2 her neurologist placed her on Zonegran.which has not worked and has caused side effects. She had some questions about the facial pain that it caused.   #3 migraine. She reports having a migraine lately for several days. She does report that getting the solumedrol and Toradol injections did help. She wants to know if she could receive those injections. As we also clarified some misconceptions about her imitrex only use once  A day we will give her a SQ injection as well.   Review of Systems  All other systems reviewed and are negative.   BP 116/69  Pulse 77  Ht 5\' 7"  (1.702 m)  Wt 155 lb (70.308 kg)  BMI 24.28 kg/m2  SpO2 97%   Objective:   Physical Exam  Vitals reviewed. Constitutional: She appears well-developed and well-nourished.  HENT:  Head: Normocephalic.  Eyes: Pupils are equal, round, and reactive to light.  Neck: Normal range of motion. Neck supple. No thyromegaly present.  Cardiovascular: Normal rate and regular rhythm.   Pulmonary/Chest: Effort normal and breath sounds normal.  Skin: Skin is warm and dry.  Psychiatric: She has a normal mood and affect. Her behavior is normal.      Assessment & Plan:  #1 fibromyalgia.  As above support her decision about not removing the mesh.  #2 Zonegran/facial pain. I reiterated that I would not recommend further use since the neurologist had her on what appears was the lowest dose   #35migraine. Patient did experience some post injection flushing w/imitrex. Would continue w/po but would acquiesce to patient preference abut further SQ Imitrex   Return in 4 months for follow up.Marland Kitchen

## 2012-02-15 DIAGNOSIS — G43909 Migraine, unspecified, not intractable, without status migrainosus: Secondary | ICD-10-CM | POA: Insufficient documentation

## 2012-02-15 DIAGNOSIS — G500 Trigeminal neuralgia: Secondary | ICD-10-CM | POA: Insufficient documentation

## 2012-02-22 ENCOUNTER — Encounter: Payer: Self-pay | Admitting: Family Medicine

## 2012-02-24 ENCOUNTER — Telehealth: Payer: Self-pay | Admitting: Family Medicine

## 2012-02-24 NOTE — Telephone Encounter (Signed)
Patient left a message wanting [place] to reprint a note Dr. Thurmond Butts had made for her so she can show another Dr.... Please call her back at [phone]. Thanks

## 2012-03-04 ENCOUNTER — Encounter: Payer: Self-pay | Admitting: Emergency Medicine

## 2012-03-04 ENCOUNTER — Emergency Department
Admission: EM | Admit: 2012-03-04 | Discharge: 2012-03-04 | Disposition: A | Discharge status: Home / Self Care | Payer: Self-pay | Source: Home / Self Care | Attending: Family Medicine | Admitting: Family Medicine

## 2012-03-04 DIAGNOSIS — G501 Atypical facial pain: Secondary | ICD-10-CM

## 2012-03-04 DIAGNOSIS — IMO0001 Reserved for inherently not codable concepts without codable children: Secondary | ICD-10-CM

## 2012-03-04 DIAGNOSIS — M797 Fibromyalgia: Secondary | ICD-10-CM

## 2012-03-04 NOTE — ED Provider Notes (Signed)
History     CSN: [id number]  Arrival date & time 03/04/12  1448   First MD Initiated Contact with Patient 03/04/12 1450      Chief Complaint  Patient presents with  . Facial Pain    HPI Pt presents today with facial pain flare.  Pt with a baseline hx/o atypical facial pain as well as fibromyalgia that has been followed by her PCP and [facility]. Pt's PCP recently left the practice. Usually has shot of toradol and solumedrol for flares but was unable because unable to see PCP. Pt would like this cocktail today.  Pain is in usual distribution today which is full body pain and aching.  R sided facial pain is persistent.   Past Medical History  Diagnosis Date  . Factor V Leiden mutation     trait  . Varicosities   . Atypical facial pain 2001    V2 distribution after dental procedures  . Fibromyalgia     Lyme titers neg, extensive w/u  . GERD (gastroesophageal reflux disease)   . History of chicken pox   . Migraine with aura   . History of shingles   . Fibromyalgia   . Fibromyalgia muscle pain   . Depression     Past Surgical History  Procedure Date  . Endometrial ablation 01/14/08  . Hysteroscopy 01/14/08  . Total abdominal hysterectomy 05/10/09    with sling, fibroids and heavy bleeding  . Appendectomy   . Tonsillectomy 1983    Family History  Problem Relation Age of Onset  . Alcohol abuse Mother   . Arthritis Mother   . Hypertension Mother   . Hyperlipidemia Mother   . Diabetes Mother   . Arthritis Maternal Grandmother   . Stroke Maternal Grandmother   . Diabetes Maternal Grandmother   . Diabetes Maternal Grandfather   . Coronary artery disease Neg Hx   . Cancer Neg Hx     History  Substance Use Topics  . Smoking status: Former Smoker -- 0.5 packs/day for 10 years    Types: Cigarettes    Quit date: 02/26/1999  . Smokeless tobacco: Never Used  . Alcohol Use: No    OB History    Grav Para Term Preterm Abortions TAB SAB Ect Mult Living   2 2               Review of Systems  All other systems reviewed and are negative.    Allergies  Sulfa antibiotics and Penicillins  Home Medications   Current Outpatient Rx  Name  Route  Sig  Dispense  Refill  . BACLOFEN 10 MG PO TABS   Oral   Take 10 mg by mouth 2 (two) times daily as needed.          Marland Kitchen CEFUROXIME AXETIL 250 MG/5ML PO SUSR   Oral   Take 10 mLs (500 mg total) by mouth 2 (two) times daily.   200 mL   0   . DEXLANSOPRAZOLE 60 MG PO CPDR   Oral   Take 1 capsule (60 mg total) by mouth daily.   90 capsule   3   . FEXOFENADINE-PSEUDOEPHED ER 180-240 MG PO TB24   Oral   Take 1 tablet by mouth daily.   30 tablet   1   . FLUOXETINE HCL 40 MG PO CAPS      TAKE ONE CAPSULE EVERY DAY   90 capsule   1   . FLUTICASONE PROPIONATE 50 MCG/ACT NA SUSP  Nasal   Place 2 sprays into the nose daily.   16 g   2   . INDOMETHACIN 50 MG PO CAPS   Oral   Take 150 mg by mouth as directed.         . LUBIPROSTONE 24 MCG PO CAPS   Oral   Take 1 capsule (24 mcg total) by mouth 2 (two) times daily with a meal.   60 capsule   11   . MELOXICAM 7.5 MG PO TABS   Oral   Take 1 tablet (7.5 mg total) by mouth daily. Do not take w/motrin or indocin   30 tablet   2   . NON FORMULARY      as directed. Iberogast-for stomach (iberis amara,angelica,chamomile,caraway fruit, St. Mary's thistle,balm leaves; peppermint leaves; celandine; and licorice root)         . NON FORMULARY      as directed. Buscopan 10 mg for IBS (Hyoscamine)         . ONDANSETRON 8 MG PO TBDP   Oral   Take 1 tablet (8 mg total) by mouth every 8 (eight) hours as needed for nausea.   90 tablet   3   . OXYCODONE HCL ER 20 MG PO TB12   Oral   Take 20 mg by mouth 4 (four) times daily.         Marland Kitchen PHENOBARBITAL 20 MG/5ML PO ELIX      TAKE 1 TEASPOONFUL EVERY 6 HOURS AS NEEDED ESOPHAGUS   473 mL   1   . RANITIDINE HCL 300 MG PO TABS   Oral   Take 1 tablet (300 mg total) by mouth at bedtime.   90  tablet   3   . SUCRALFATE 1 G PO TABS   Oral   Take 2 tablets (2 g total) by mouth 2 (two) times daily before a meal.   360 tablet   3   . SUMATRIPTAN SUCCINATE 100 MG PO TABS   Oral   Take 1 tablet (100 mg total) by mouth every 2 (two) hours as needed.   30 tablet   3     BP 107/76  Pulse 93  Temp 98.6 F (37 C) (Oral)  Resp 16  Ht 5\' 7"  (1.702 m)  Wt 155 lb (70.308 kg)  BMI 24.28 kg/m2  SpO2 97%  Physical Exam  Constitutional: She appears well-developed and well-nourished.  HENT:  Head: Normocephalic and atraumatic.    Right Ear: External ear normal.  Left Ear: External ear normal.  Eyes: Conjunctivae normal are normal. Pupils are equal, round, and reactive to light.  Neck: Normal range of motion. Neck supple.  Cardiovascular: Normal rate, regular rhythm and normal heart sounds.   Pulmonary/Chest: Effort normal and breath sounds normal.  Musculoskeletal:       Arms:      Legs:      Multiple diffuse areas of tenderness to palpation.      ED Course  Procedures (including critical care time)  Labs Reviewed - No data to display No results found.   1. Fibromyalgia muscle pain   2. Facial pain       MDM  Pt treated with cocktail today of solumedrol and toradol. Follow up with neuro in am as scheduled.  Discussed general care and red flags.  Otherwise follow up as needed.     The patient and/or caregiver has been counseled thoroughly with regard to treatment plan and/or medications prescribed including dosage, schedule,  interactions, rationale for use, and possible side effects and they verbalize understanding. Diagnoses and expected course of recovery discussed and will return if not improved as expected or if the condition worsens. Patient and/or caregiver verbalized understanding.             Doree Albee, MD 03/04/12 1534

## 2012-03-04 NOTE — ED Notes (Signed)
Rt facial pain, constant, never stops

## 2012-11-18 ENCOUNTER — Encounter: Payer: Self-pay | Admitting: Family Medicine

## 2012-11-18 ENCOUNTER — Ambulatory Visit (INDEPENDENT_AMBULATORY_CARE_PROVIDER_SITE_OTHER): Payer: BC Managed Care – PPO | Admitting: Family Medicine

## 2012-11-18 VITALS — BP 113/78 | HR 103 | Wt 139.0 lb

## 2012-11-18 DIAGNOSIS — R9431 Abnormal electrocardiogram [ECG] [EKG]: Secondary | ICD-10-CM | POA: Insufficient documentation

## 2012-11-18 DIAGNOSIS — R000 Tachycardia, unspecified: Secondary | ICD-10-CM

## 2012-11-18 NOTE — Patient Instructions (Addendum)
EKG was obtained showing sinus tachycardia at rest, deep breathing, breath holding, and during valsalva. There were no conduction abnormalities other than a prolonged QTc interval during the period of breath holding. QTc intervals were less than 460 milliseconds during all other periods.

## 2012-11-18 NOTE — Progress Notes (Addendum)
CC: Christy Shaffer is a 42 y.o. female is here for Perform EKG   Subjective: HPI:  Former patient of Dr. Thurmond Butts returns due to concerns she and her neurologist, Dr. Clint Lipps, have regarding a subjective heart beat of 140 beats per minute soon after taking her first 5 mg dose of methadone sometime this last week. Since then she has continued to take methadone she states overall she feels better compared to taking other narcotic options for her chronic pain syndrome, however she has felt occasional shortness of breath and rapid heartbeat Different from what she usually feels on a daily basis since starting methadone.  She has no known personal history of an arrhythmia she does report her grandfather passed from a cardiac issue 20 years ago but no other information/specifics are available. She denies subjective irregular rhythm heartbeat, lightheadedness, presyncopal episodes nor motor or sensory disturbances since starting methadone other than that described above.  She currently denies fevers, chills, headache, confusion, nausea, vomiting nor abdominal pain Review Of Systems Outlined In HPI  Past Medical History  Diagnosis Date  . Factor V Leiden mutation     trait  . Varicosities   . Atypical facial pain 2001    V2 distribution after dental procedures  . Fibromyalgia     Lyme titers neg, extensive w/u  . GERD (gastroesophageal reflux disease)   . History of chicken pox   . Migraine with aura   . History of shingles   . Fibromyalgia   . Fibromyalgia muscle pain   . Depression      Family History  Problem Relation Age of Onset  . Alcohol abuse Mother   . Arthritis Mother   . Hypertension Mother   . Hyperlipidemia Mother   . Diabetes Mother   . Arthritis Maternal Grandmother   . Stroke Maternal Grandmother   . Diabetes Maternal Grandmother   . Diabetes Maternal Grandfather   . Coronary artery disease Neg Hx   . Cancer Neg Hx      History  Substance Use Topics  . Smoking status:  Former Smoker -- 0.50 packs/day for 10 years    Types: Cigarettes    Quit date: 02/26/1999  . Smokeless tobacco: Never Used  . Alcohol Use: No     Objective: Filed Vitals:   11/18/12 1039  BP: 113/78  Pulse: 103    Vital signs reviewed. General: Alert and Oriented, No Acute Distress HEENT: Pupils equal, round, reactive to light. Conjunctivae clear.  External ears unremarkable.  Moist mucous membranes. Lungs: Clear and comfortable work of breathing, speaking in full sentences without accessory muscle use. Cardiac: Mild tachycardia no irregular rhythm Neuro: CN II-XII grossly intact, gait normal. Extremities: No peripheral edema.  Strong peripheral pulses.  Mental Status: No depression, anxiety, nor agitation. Logical though process. Skin: Warm and dry.  Assessment & Plan: Avaiah was seen today for perform ekg.  Diagnoses and associated orders for this visit:  Tachycardia  Prolonged QT interval    4, 12-lead EKG pages were obtained. These were obtained while resting, deep breathing for 15 seconds, breath holding for 15-30 seconds, and under Valsalva maneuver 45-10 seconds. On all tracings she sure exhibited sinus tachycardia with normal axis, normal QRS morphology, no pathologic Q waves, no ST segment elevation or depression, normal T wave morphology. During the episode of breath-holding she exhibited a prolonged QTC with a corrected value of 596. Her highest QTC otherwise was 457 using the upper limit of normal for QTC being less than 470.  I will include these for tracings when faxing records to Dr. Clint Lipps as his note mentions consulting with [facility] anesthesia and cardiology.  Although my experience with prolonged QTC is limited I would expect that methadone is likely contraindicated in this patient given her QTC seen during breath holding.  Return if symptoms worsen or fail to improve, for CPE.

## 2012-11-20 ENCOUNTER — Telehealth: Payer: Self-pay | Admitting: *Deleted

## 2012-11-20 DIAGNOSIS — R000 Tachycardia, unspecified: Secondary | ICD-10-CM

## 2012-11-26 ENCOUNTER — Encounter: Payer: Self-pay | Admitting: Family Medicine

## 2012-11-27 ENCOUNTER — Encounter: Payer: BC Managed Care – PPO | Admitting: Family Medicine

## 2012-12-31 ENCOUNTER — Other Ambulatory Visit: Payer: Self-pay

## 2013-06-17 ENCOUNTER — Telehealth: Payer: Self-pay | Admitting: Family Medicine

## 2013-06-17 DIAGNOSIS — M797 Fibromyalgia: Secondary | ICD-10-CM

## 2013-06-17 DIAGNOSIS — G500 Trigeminal neuralgia: Secondary | ICD-10-CM

## 2013-06-17 MED ORDER — SUMATRIPTAN SUCCINATE 100 MG PO TABS: 100.0000 mg | ORAL_TABLET | ORAL | Status: DC | PRN

## 2013-06-17 NOTE — Telephone Encounter (Signed)
Refill req 

## 2013-09-01 ENCOUNTER — Ambulatory Visit (INDEPENDENT_AMBULATORY_CARE_PROVIDER_SITE_OTHER): Payer: BC Managed Care – PPO | Admitting: Professional Counselor

## 2013-09-01 ENCOUNTER — Encounter (HOSPITAL_COMMUNITY): Payer: Self-pay | Admitting: Professional Counselor

## 2013-09-01 DIAGNOSIS — F331 Major depressive disorder, recurrent, moderate: Secondary | ICD-10-CM
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
## 2013-09-16 ENCOUNTER — Ambulatory Visit (HOSPITAL_COMMUNITY): Payer: Self-pay | Admitting: Professional Counselor

## 2013-09-25 ENCOUNTER — Emergency Department
Admission: EM | Admit: 2013-09-25 | Discharge: 2013-09-25 | Disposition: A | Discharge status: Home / Self Care | Payer: BC Managed Care – PPO | Source: Home / Self Care | Attending: Family Medicine | Admitting: Family Medicine

## 2013-09-25 ENCOUNTER — Encounter: Payer: Self-pay | Admitting: Emergency Medicine

## 2013-09-25 DIAGNOSIS — B029 Zoster without complications: Secondary | ICD-10-CM

## 2013-09-25 HISTORY — DX: Trigeminal neuralgia: G50.0

## 2013-09-25 MED ORDER — PREDNISONE 10 MG PO TABS: ORAL_TABLET | ORAL | Status: DC

## 2013-09-25 MED ORDER — KETOROLAC TROMETHAMINE 60 MG/2ML IM SOLN
60.0000 mg | Freq: Once | INTRAMUSCULAR | Status: AC
  Administered 2013-09-25: 60 mg via INTRAMUSCULAR

## 2013-09-25 NOTE — Discharge Instructions (Signed)
Resume Valtrex 1000mg , one tab three times daily for one week, then decrease dose to 500mg  three times daily as previously prescribed.

## 2013-09-25 NOTE — ED Provider Notes (Signed)
CSN: [id number]     Arrival date & time 09/25/13  1338 History   First MD Initiated Contact with Patient 09/25/13 1406     Chief Complaint  Patient presents with  . Herpes Zoster       HPI Comments: Patient takes Valtrex 500mg  TID for chronic mouth ulcers, prescribed by her pain specialist Dr. Valinda Party at the [facility] in [place], [place].  She developed herpes zoster on her right upper anterior chest about 2 weeks ago, and her Valtrex was increased to 1gm TID.  Her shingles had resolved, and she discontinued Valtrex 1000mg  several days ago.  Yesterday she developed recurrent rash at the site of previous eruption, with burning/stinging pain in a dermatomal distribution over her right shoulder and right upper chest.  Patient is a 43 y.o. female presenting with rash. The history is provided by the patient.  Rash Pain location: right upper anterior chest. Pain quality: burning and stabbing   Pain radiates to:  R shoulder Pain severity:  Moderate Onset quality:  Sudden Duration:  2 days Timing:  Constant Progression:  Unchanged Chronicity:  Recurrent Context comment:  Increased stress Relieved by:  Nothing Worsened by:  Nothing tried Ineffective treatments: chronic pain medications. Associated symptoms: chest pain, constipation and fatigue   Associated symptoms: no chills, no dysuria, no fever, no nausea, no shortness of breath and no sore throat     Past Medical History  Diagnosis Date  . Factor V Leiden mutation     trait  . Varicosities   . Atypical facial pain 2001    V2 distribution after dental procedures  . Fibromyalgia     Lyme titers neg, extensive w/u  . GERD (gastroesophageal reflux disease)   . History of chicken pox   . Migraine with aura   . History of shingles   . Fibromyalgia   . Fibromyalgia muscle pain   . Depression   . Trigeminal neuralgia pain    Past Surgical History  Procedure Laterality Date  . Endometrial ablation  01/14/08  .  Hysteroscopy  01/14/08  . Total abdominal hysterectomy  05/10/09    with sling, fibroids and heavy bleeding  . Appendectomy    . Tonsillectomy  1983   Family History  Problem Relation Age of Onset  . Alcohol abuse Mother   . Arthritis Mother   . Hypertension Mother   . Hyperlipidemia Mother   . Diabetes Mother   . Arthritis Maternal Grandmother   . Stroke Maternal Grandmother   . Diabetes Maternal Grandmother   . Diabetes Maternal Grandfather   . Coronary artery disease Neg Hx   . Cancer Neg Hx    History  Substance Use Topics  . Smoking status: Former Smoker -- 0.50 packs/day for 10 years    Types: Cigarettes    Quit date: 02/26/1999  . Smokeless tobacco: Never Used  . Alcohol Use: No   OB History   Grav Para Term Preterm Abortions TAB SAB Ect Mult Living   2 2             Review of Systems  Constitutional: Positive for fatigue. Negative for fever and chills.  HENT: Negative for sore throat.   Respiratory: Negative for shortness of breath.   Cardiovascular: Positive for chest pain.  Gastrointestinal: Positive for constipation. Negative for nausea.  Genitourinary: Negative for dysuria.  Skin: Positive for rash.  All other systems reviewed and are negative.   Allergies  Sulfa antibiotics; Carbamazepine; Duloxetine; Lacosamide; Neurontin;  Pregabalin; and Penicillins  Home Medications   Prior to Admission medications   Medication Sig Start Date End Date Taking? Authorizing Provider  baclofen (LIORESAL) 10 MG tablet Take 10 mg by mouth 2 (two) times daily as needed.     Historical Provider, MD  fexofenadine-pseudoephedrine (ALLEGRA-D 24 HOUR) 180-240 MG per 24 hr tablet Take 1 tablet by mouth daily. 12/10/11   Hassan RowanEugene Wade, MD  FLUoxetine (PROZAC) 40 MG capsule TAKE ONE CAPSULE EVERY DAY 03/15/11   Allie BossierMyra C Dove, MD  fluticasone (FLONASE) 50 MCG/ACT nasal spray Place 2 sprays into the nose daily. 12/10/11   Hassan RowanEugene Wade, MD  indomethacin (INDOCIN) 50 MG capsule Take 150  mg by mouth as directed.    Historical Provider, MD  lubiprostone (AMITIZA) 24 MCG capsule Take 1 capsule (24 mcg total) by mouth 2 (two) times daily with a meal. 08/06/11 11/18/12  Hassan RowanEugene Wade, MD  NON FORMULARY as directed. Iberogast-for stomach (iberis amara,angelica,chamomile,caraway fruit, St. Mary's thistle,balm leaves; peppermint leaves; celandine; and licorice root)    Historical Provider, MD  NON FORMULARY as directed. Buscopan 10 mg for IBS (Hyoscamine)    Historical Provider, MD  ondansetron (ZOFRAN ODT) 8 MG disintegrating tablet Take 1 tablet (8 mg total) by mouth every 8 (eight) hours as needed for nausea. 02/13/12   Hassan RowanEugene Wade, MD  PHENObarbital 20 MG/5ML elixir TAKE 1 TEASPOONFUL EVERY 6 HOURS AS NEEDED ESOPHAGUS 12/31/11   Hassan RowanEugene Wade, MD  predniSONE (DELTASONE) 10 MG tablet Take 2 tabs by mouth today, then two tabs twice daily for three days, then one tab twice daily for 2 days, then 1 tab daily for two days.  Take PC 09/25/13   Lattie HawStephen A Lashawne Dura, MD  SUMAtriptan (IMITREX) 100 MG tablet Take 1 tablet (100 mg total) by mouth every 2 (two) hours as needed. No more than two in 24 hours. 06/17/13   Sean Hommel, DO   BP 138/83  Pulse 111  Temp(Src) 99.1 F (37.3 C) (Oral)  Ht 5\' 7"  (1.702 m)  Wt 129 lb 8 oz (58.741 kg)  BMI 20.28 kg/m2  SpO2 99% Physical Exam  Nursing note and vitals reviewed. Constitutional: She is oriented to person, place, and time. She appears well-developed and well-nourished. No distress.  HENT:  Head: Normocephalic.  Nose: Nose normal.  Mouth/Throat: Oropharynx is clear and moist.  Eyes: Conjunctivae and EOM are normal. Pupils are equal, round, and reactive to light.  Neck: Normal range of motion. Neck supple.  Cardiovascular: Normal heart sounds.   Pulmonary/Chest: Breath sounds normal.    Herpetiform eruption present right upper anterior chest as noted on diagram.    Abdominal: Bowel sounds are normal. There is no tenderness.  Musculoskeletal: She  exhibits no edema.  Lymphadenopathy:    She has no cervical adenopathy.  Neurological: She is alert and oriented to person, place, and time.  Skin: Skin is warm and dry.    ED Course  Procedures  none     MDM   1. Herpes zoster; recurrent    Toradol 60mg  IM. Begin prednisone taper. Resume Valtrex 1000mg , one tab three times daily for one week, then decrease dose to 500mg  three times daily as previously prescribed. Followup with pain specialist Dr. Alfonso RamusLietke    Sair Faulcon A Gussie Murton, MD 09/25/13 [id number]

## 2013-09-25 NOTE — ED Notes (Signed)
Pt states she had a similar rash in a slightly lower level on her chest on July 13 and has been on Valcovir  Now the same rash has recurred on the chest area superior to her previous outbreak.

## 2013-11-11 ENCOUNTER — Telehealth: Payer: Self-pay

## 2013-11-11 NOTE — Telephone Encounter (Signed)
I would recommend an appointment to discuss risks and benefits of this type of injection since the risks are significant and i've not Rxed this for her before.

## 2013-11-11 NOTE — Telephone Encounter (Signed)
Christy Shaffer called and left a message stating; She has Factor v Leiden and is traveling to [place]. She states she needs a prescription for blood thinner injection to give herself on the flight to [place] and the return flight. Please advise.

## 2013-11-12 NOTE — Telephone Encounter (Signed)
She states she will think of something eles

## 2013-11-12 NOTE — Telephone Encounter (Signed)
Pt notified of below recomendations

## 2013-12-07 ENCOUNTER — Encounter: Payer: Self-pay | Admitting: Family Medicine

## 2013-12-17 ENCOUNTER — Other Ambulatory Visit: Payer: Self-pay | Admitting: Psychiatry

## 2013-12-17 DIAGNOSIS — G500 Trigeminal neuralgia: Secondary | ICD-10-CM

## 2013-12-17 DIAGNOSIS — G43809 Other migraine, not intractable, without status migrainosus: Secondary | ICD-10-CM

## 2013-12-17 DIAGNOSIS — M797 Fibromyalgia: Secondary | ICD-10-CM

## 2013-12-17 DIAGNOSIS — G501 Atypical facial pain: Secondary | ICD-10-CM

## 2013-12-22 ENCOUNTER — Ambulatory Visit
Admission: RE | Admit: 2013-12-22 | Discharge: 2013-12-22 | Disposition: A | Discharge status: Home / Self Care | Payer: BC Managed Care – PPO | Source: Ambulatory Visit | Attending: Psychiatry | Admitting: Psychiatry

## 2013-12-22 DIAGNOSIS — G501 Atypical facial pain: Secondary | ICD-10-CM

## 2013-12-22 DIAGNOSIS — G43809 Other migraine, not intractable, without status migrainosus: Secondary | ICD-10-CM

## 2013-12-22 DIAGNOSIS — G500 Trigeminal neuralgia: Secondary | ICD-10-CM

## 2013-12-22 DIAGNOSIS — M797 Fibromyalgia: Secondary | ICD-10-CM

## 2013-12-22 MED ORDER — GADOBENATE DIMEGLUMINE 529 MG/ML IV SOLN
11.0000 mL | Freq: Once | INTRAVENOUS | Status: AC | PRN
  Administered 2013-12-22: 11 mL via INTRAVENOUS

## 2013-12-27 ENCOUNTER — Encounter: Payer: Self-pay | Admitting: Emergency Medicine

## 2014-01-03 ENCOUNTER — Encounter: Payer: Self-pay | Admitting: Family Medicine

## 2014-01-03 DIAGNOSIS — R519 Headache, unspecified: Secondary | ICD-10-CM | POA: Insufficient documentation

## 2014-01-03 DIAGNOSIS — R51 Headache: Secondary | ICD-10-CM

## 2014-08-08 ENCOUNTER — Encounter: Payer: Self-pay | Admitting: Family Medicine

## 2014-08-08 ENCOUNTER — Ambulatory Visit (INDEPENDENT_AMBULATORY_CARE_PROVIDER_SITE_OTHER): Payer: BLUE CROSS/BLUE SHIELD | Admitting: Family Medicine

## 2014-08-08 ENCOUNTER — Ambulatory Visit (INDEPENDENT_AMBULATORY_CARE_PROVIDER_SITE_OTHER): Payer: BLUE CROSS/BLUE SHIELD

## 2014-08-08 VITALS — BP 123/85 | HR 108 | Wt 138.0 lb

## 2014-08-08 DIAGNOSIS — D6851 Activated protein C resistance: Secondary | ICD-10-CM | POA: Diagnosis not present

## 2014-08-08 DIAGNOSIS — F32A Depression, unspecified: Secondary | ICD-10-CM | POA: Insufficient documentation

## 2014-08-08 DIAGNOSIS — F329 Major depressive disorder, single episode, unspecified: Secondary | ICD-10-CM

## 2014-08-08 DIAGNOSIS — R61 Generalized hyperhidrosis: Secondary | ICD-10-CM | POA: Diagnosis not present

## 2014-08-08 DIAGNOSIS — R51 Headache: Secondary | ICD-10-CM | POA: Diagnosis not present

## 2014-08-08 DIAGNOSIS — M25551 Pain in right hip: Secondary | ICD-10-CM

## 2014-08-08 DIAGNOSIS — R519 Headache, unspecified: Secondary | ICD-10-CM

## 2014-08-08 DIAGNOSIS — Z711 Person with feared health complaint in whom no diagnosis is made: Secondary | ICD-10-CM

## 2014-08-08 DIAGNOSIS — M545 Low back pain: Secondary | ICD-10-CM | POA: Diagnosis not present

## 2014-08-08 MED ORDER — HEPARIN SODIUM (PORCINE) PF 5000 UNIT/0.5ML IJ SOLN
INTRAMUSCULAR | Status: DC
Start: 2014-08-08 — End: 2014-11-01

## 2014-08-08 MED ORDER — NYSTATIN 100000 UNIT/ML MT SUSP: OROMUCOSAL | Status: AC

## 2014-08-08 MED ORDER — FLUOXETINE HCL 10 MG PO TABS: 5.0000 mg | ORAL_TABLET | Freq: Every day | ORAL | Status: DC

## 2014-08-08 NOTE — Progress Notes (Signed)
CC: Christy Shaffer is a 44 y.o. female is here for Night Sweats   Subjective: HPI:  Patient planes of night sweats occurring on a nightly basis. They're unpredictable and last for a few minutes. No interventions as of yet. They've been present for at least a month or 2 now. Symptoms are mild to moderate in severity but seem to be getting worse. She had a hysterectomy many years ago sure she is not certain if she is becoming menopausal. She denies any unintentional weight loss or swollen lymph nodes.  She has a factor V Leiden mutation and will be traveling overseas to visit her mother in [place] and worries about the plane flight putting her at risk for DVTs. In the past she's used heparin to help reduce this risk. She is requesting a refill.  She would like to know if I can give her a referral to a plastic surgeon to help with breast augmentation. She denies any complaints other than she is unhappy with the appearance of the breast. No other breast complaints.  Complains of right low back pain further localized to just above the sacroiliac joint on the right. It's improved with any flexion of the hips but worse after sitting for long periods of time. Pain is nonradiating and described only as a dull and mild to moderate in severity.  She's requesting a refill on Prozac. She been getting this from her neurologist and she noticed that 20 mg was giving her headaches that she has been trying to take only 10 mg a day but is compensated by the fact that it's in a capsule and the powder tastes horrible. She tells me on the 10 mg formulation she has no headache and expenses no depressive symptoms.    Review Of Systems Outlined In HPI  Past Medical History  Diagnosis Date  . Factor V Leiden mutation     trait  . Varicosities   . Atypical facial pain 2001    V2 distribution after dental procedures  . Fibromyalgia     Lyme titers neg, extensive w/u  . GERD (gastroesophageal reflux disease)   .  History of chicken pox   . Migraine with aura   . History of shingles   . Fibromyalgia   . Fibromyalgia muscle pain   . Depression   . Trigeminal neuralgia pain     Past Surgical History  Procedure Laterality Date  . Endometrial ablation  01/14/08  . Hysteroscopy  01/14/08  . Total abdominal hysterectomy  05/10/09    with sling, fibroids and heavy bleeding  . Appendectomy    . Tonsillectomy  1983   Family History  Problem Relation Age of Onset  . Alcohol abuse Mother   . Arthritis Mother   . Hypertension Mother   . Hyperlipidemia Mother   . Diabetes Mother   . Arthritis Maternal Grandmother   . Stroke Maternal Grandmother   . Diabetes Maternal Grandmother   . Diabetes Maternal Grandfather   . Coronary artery disease Neg Hx   . Cancer Neg Hx     History   Social History  . Marital Status: Married    Spouse Name: N/A  . Number of Children: N/A  . Years of Education: N/A   Occupational History  . Not on file.   Social History Main Topics  . Smoking status: Former Smoker -- 0.50 packs/day for 10 years    Types: Cigarettes    Quit date: 02/26/1999  . Smokeless tobacco: Never Used  .  Alcohol Use: No  . Drug Use: No  . Sexual Activity: Yes    Birth Control/ Protection: Other-see comments     Comment: ablation   Other Topics Concern  . Not on file   Social History Narrative   Caffeine: none   Lives with daughters and husband, 1 dog   Occupation: none, used to be Sales executive, prior medical foot care business      Other physicians ([facility])   Pain: Dr. Thomasena Edis PA Idolina Primer   Neurology: Dr.Liedtke   Rheum: Dr. Azzie Glatter     Objective: BP 123/85 mmHg  Pulse 108  Wt 138 lb (62.596 kg)  Vital signs reviewed. General: Alert and Oriented, No Acute Distress HEENT: Pupils equal, round, reactive to light. Conjunctivae clear.  External ears unremarkable.  Moist mucous membranes. Lungs: Clear and comfortable work of breathing, speaking in full sentences without  accessory muscle use. Cardiac: Regular rate and rhythm.  Neuro: CN II-XII grossly intact, gait normal. Extremities: No peripheral edema.  Strong peripheral pulses.  Mental Status: No depression, anxiety, nor agitation. Logical though process. Skin: Warm and dry.  Assessment & Plan: Christy Shaffer was seen today for night sweats.  Diagnoses and all orders for this visit:  Night sweat Orders: -     FLUoxetine (PROZAC) 10 MG tablet; Take 0.5 tablets (5 mg total) by mouth daily. -     Heparin Sodium, Porcine, PF 5000 UNIT/0.5ML SOLN; Inject 5000 Units subcutaneously every 8 hours when confined (such as flying). -     nystatin (MYCOSTATIN) 100000 UNIT/ML suspension; Swish and spit 5mL every six hours as needed for mouth pain. -     COMPLETE METABOLIC PANEL WITH GFR -     FSH -     TSH -     Lipid panel -     DG Pelvis 1-2 Views; Future -     Ambulatory referral to Plastic Surgery  Nonintractable headache, unspecified chronicity pattern, unspecified headache type Orders: -     FLUoxetine (PROZAC) 10 MG tablet; Take 0.5 tablets (5 mg total) by mouth daily. -     Heparin Sodium, Porcine, PF 5000 UNIT/0.5ML SOLN; Inject 5000 Units subcutaneously every 8 hours when confined (such as flying). -     nystatin (MYCOSTATIN) 100000 UNIT/ML suspension; Swish and spit 5mL every six hours as needed for mouth pain. -     COMPLETE METABOLIC PANEL WITH GFR -     FSH -     TSH -     Lipid panel -     DG Pelvis 1-2 Views; Future -     Ambulatory referral to Plastic Surgery  Factor V Leiden Orders: -     FLUoxetine (PROZAC) 10 MG tablet; Take 0.5 tablets (5 mg total) by mouth daily. -     Heparin Sodium, Porcine, PF 5000 UNIT/0.5ML SOLN; Inject 5000 Units subcutaneously every 8 hours when confined (such as flying). -     nystatin (MYCOSTATIN) 100000 UNIT/ML suspension; Swish and spit 5mL every six hours as needed for mouth pain. -     COMPLETE METABOLIC PANEL WITH GFR -     FSH -     TSH -     Lipid  panel -     DG Pelvis 1-2 Views; Future -     Ambulatory referral to Plastic Surgery  Concern about female breast disease without diagnosis Orders: -     FLUoxetine (PROZAC) 10 MG tablet; Take 0.5 tablets (5 mg total) by mouth daily. -  Heparin Sodium, Porcine, PF 5000 UNIT/0.5ML SOLN; Inject 5000 Units subcutaneously every 8 hours when confined (such as flying). -     nystatin (MYCOSTATIN) 100000 UNIT/ML suspension; Swish and spit 5mL every six hours as needed for mouth pain. -     COMPLETE METABOLIC PANEL WITH GFR -     FSH -     TSH -     Lipid panel -     DG Pelvis 1-2 Views; Future -     Ambulatory referral to Plastic Surgery  Pelvic joint pain, right Orders: -     FLUoxetine (PROZAC) 10 MG tablet; Take 0.5 tablets (5 mg total) by mouth daily. -     Heparin Sodium, Porcine, PF 5000 UNIT/0.5ML SOLN; Inject 5000 Units subcutaneously every 8 hours when confined (such as flying). -     nystatin (MYCOSTATIN) 100000 UNIT/ML suspension; Swish and spit 5mL every six hours as needed for mouth pain. -     COMPLETE METABOLIC PANEL WITH GFR -     FSH -     TSH -     Lipid panel -     DG Pelvis 1-2 Views; Future -     Ambulatory referral to Plastic Surgery  Depression   Night sweats: Rule out hyper thyroidism and check an FSH to see if she's reached menopausal state. Factor V Leiden: Heparin refill seems reasonable, discussed using it subcutaneously every 8-12 hours only if confined in an environment such as airplane or car ride longer than 5 hours. Referral to plastic surgery per her request Right pelvic pain: Obtaining x-rays to rule out sacroiliac joint osteoarthritis Depression: Controlled on fluoxetine, provided 10 mg in tablet form. At the end of the visit she requested a refill on nystatin that she uses for aphthous ulcers, for some reason her aphthous ulcer pain greatly improved and healing time is reduced with using nystatin swish and spit.  40 minutes spent face-to-face  during visit today of which at least 50% was counseling or coordinating care regarding: 1. Night sweat   2. Nonintractable headache, unspecified chronicity pattern, unspecified headache type   3. Factor V Leiden   4. Concern about female breast disease without diagnosis   5. Pelvic joint pain, right   6. Depression       Return if symptoms worsen or fail to improve.

## 2014-08-09 LAB — COMPLETE METABOLIC PANEL WITH GFR
ALBUMIN: 4.5 g/dL (ref 3.5–5.2)
ALT: 29 U/L (ref 0–35)
AST: 19 U/L (ref 0–37)
Alkaline Phosphatase: 38 U/L — ABNORMAL LOW (ref 39–117)
BUN: 12 mg/dL (ref 6–23)
CALCIUM: 9.3 mg/dL (ref 8.4–10.5)
CHLORIDE: 106 meq/L (ref 96–112)
CO2: 25 mEq/L (ref 19–32)
CREATININE: 0.82 mg/dL (ref 0.50–1.10)
GFR, Est African American: >89 mL/min
GFR, Est Non African American: 86 mL/min
GLUCOSE: 83 mg/dL (ref 70–99)
Potassium: 4.3 mEq/L (ref 3.5–5.3)
Sodium: 142 mEq/L (ref 135–145)
Total Bilirubin: 0.5 mg/dL (ref 0.2–1.2)
Total Protein: 7.4 g/dL (ref 6.0–8.3)

## 2014-08-09 LAB — TSH: TSH: 2.82 u[IU]/mL (ref 0.350–4.500)

## 2014-08-09 LAB — LIPID PANEL
Cholesterol: 256 mg/dL — ABNORMAL HIGH (ref 0–200)
HDL: 83 mg/dL (ref >=46)
LDL CALC: 155 mg/dL — AB (ref 0–99)
TRIGLYCERIDES: 89 mg/dL (ref <150)
Total CHOL/HDL Ratio: 3.1 Ratio
VLDL: 18 mg/dL (ref 0–40)

## 2014-08-09 LAB — FOLLICLE STIMULATING HORMONE: FSH: 29.1 m[IU]/mL

## 2014-10-25 ENCOUNTER — Telehealth: Payer: Self-pay | Admitting: *Deleted

## 2014-10-25 NOTE — Telephone Encounter (Signed)
Pharmacist called and wanted to know if there was any reason why they couldn't fill the heparin rx with preservative . I think her rx is for preservative free.

## 2014-10-26 NOTE — Telephone Encounter (Signed)
Pharmacy notified.

## 2014-10-26 NOTE — Telephone Encounter (Signed)
Christy Shaffer, It's ok to fill the Rx with a preservative.

## 2014-11-01 ENCOUNTER — Ambulatory Visit (INDEPENDENT_AMBULATORY_CARE_PROVIDER_SITE_OTHER): Payer: BLUE CROSS/BLUE SHIELD | Admitting: Family Medicine

## 2014-11-01 ENCOUNTER — Other Ambulatory Visit: Payer: Self-pay | Admitting: Family Medicine

## 2014-11-01 ENCOUNTER — Encounter: Payer: Self-pay | Admitting: Family Medicine

## 2014-11-01 VITALS — BP 148/89 | HR 134 | Wt 137.0 lb

## 2014-11-01 DIAGNOSIS — R197 Diarrhea, unspecified: Secondary | ICD-10-CM

## 2014-11-01 MED ORDER — DIPHENOXYLATE-ATROPINE 2.5-0.025 MG PO TABS: 1.0000 | ORAL_TABLET | Freq: Four times a day (QID) | ORAL | Status: DC | PRN

## 2014-11-01 NOTE — Progress Notes (Signed)
CC: Christy Shaffer is a 44 y.o. female is here for diarrhea x 10 days   Subjective: HPI:  Chief complaint of diarrhea for the past 10 days. She reports going from 3-6 times every day it's liquid, painless without blood or melanotic appearance. Not accompanied by any abdominal pain. Symptoms began the same day that she started taking a low dose of Narcan that was provided by her neurologist in hopes of helping with fibromyalgia symptoms. She tells me 20 minutes after taking her daily dose of Narcan she gets a tremor, feels nauseous, loses her appetite and starts to feel her body burning. Symptoms are moderate to severe for the first few hours and then remain mild to moderate for the rest of the day. She decided not to take the medication today. She stopped taking all narcotics over a month ago and had no withdrawal symptoms in that process. Her current symptoms have not been improved with clonidine or clonazepam by her neurologist. She's had subjective fever and chills, diffuse body pain and decreased appetite ever since this all started. There is no pulmonary or genitourinary symptoms. Other than above nothing seems to help symptoms. She denies any recent anabolic use or being in environments that could put her at risk for C. difficile. No recent travel.   Review Of Systems Outlined In HPI  Past Medical History  Diagnosis Date  . Factor V Leiden mutation     trait  . Varicosities   . Atypical facial pain 2001    V2 distribution after dental procedures  . Fibromyalgia     Lyme titers neg, extensive w/u  . GERD (gastroesophageal reflux disease)   . History of chicken pox   . Migraine with aura   . History of shingles   . Fibromyalgia   . Fibromyalgia muscle pain   . Depression   . Trigeminal neuralgia pain     Past Surgical History  Procedure Laterality Date  . Endometrial ablation  01/14/08  . Hysteroscopy  01/14/08  . Total abdominal hysterectomy  05/10/09    with sling, fibroids and  heavy bleeding  . Appendectomy    . Tonsillectomy  1983   Family History  Problem Relation Age of Onset  . Alcohol abuse Mother   . Arthritis Mother   . Hypertension Mother   . Hyperlipidemia Mother   . Diabetes Mother   . Arthritis Maternal Grandmother   . Stroke Maternal Grandmother   . Diabetes Maternal Grandmother   . Diabetes Maternal Grandfather   . Coronary artery disease Neg Hx   . Cancer Neg Hx     Social History   Social History  . Marital Status: Married    Spouse Name: N/A  . Number of Children: N/A  . Years of Education: N/A   Occupational History  . Not on file.   Social History Main Topics  . Smoking status: Former Smoker -- 0.50 packs/day for 10 years    Types: Cigarettes    Quit date: 02/26/1999  . Smokeless tobacco: Never Used  . Alcohol Use: No  . Drug Use: No  . Sexual Activity: Yes    Birth Control/ Protection: Other-see comments     Comment: ablation   Other Topics Concern  . Not on file   Social History Narrative   Caffeine: none   Lives with daughters and husband, 1 dog   Occupation: none, used to be Sales executive, prior medical foot care business      Other physicians ([facility])  Pain: Dr. Thomasena Edis PA Idolina Primer   Neurology: Dr.Liedtke   Rheum: Dr. Azzie Glatter     Objective: BP 148/89 mmHg  Pulse 134  Wt 137 lb (62.143 kg)  General: Alert and Oriented, No Acute Distress HEENT: Pupils equal, round, reactive to light. Conjunctivae clear.  Moist mucous membranes pharynx unremarkable Lungs: Clear to auscultation bilaterally, no wheezing/ronchi/rales.  Comfortable work of breathing. Good air movement. Cardiac: Mild tachycardia regular rhythm. Normal S1/S2.  No murmurs, rubs, nor gallops.   Abdomen: Soft and flat Extremities: No peripheral edema.  Strong peripheral pulses.  Mental Status: No depression, anxiety, nor agitation. Skin: Warm and dry.  Assessment & Plan: Yamel was seen today for diarrhea x 10 days.  Diagnoses and all orders  for this visit:  Diarrhea -     COMPLETE METABOLIC PANEL WITH GFR -     TSH -     CBC with Differential/Platelet -     diphenoxylate-atropine (LOMOTIL) 2.5-0.025 MG per tablet; Take 1 tablet by mouth 4 (four) times daily as needed for diarrhea or loose stools. -     Clostridium difficile EIA   Discussed the collection of her symptoms could be attributed to the Narcan therefore stop this immediately. After she provides [place] with a stool sample while this is pending she can start Lomotil, I'd like to rule out C. difficile. Given that the differential remains broad obtaining thyroid function, metabolic panel and looking for infection with CBC. Ultimate plan will be based on the above results.   Return if symptoms worsen or fail to improve.

## 2014-11-02 ENCOUNTER — Encounter: Payer: Self-pay | Admitting: Family Medicine

## 2014-11-02 ENCOUNTER — Telehealth: Payer: Self-pay | Admitting: *Deleted

## 2014-11-02 LAB — CBC WITH DIFFERENTIAL/PLATELET
BASOS PCT: 0 % (ref 0–1)
Basophils Absolute: 0 10*3/uL (ref 0.0–0.1)
EOS ABS: 0 10*3/uL (ref 0.0–0.7)
EOS PCT: 0 % (ref 0–5)
HCT: 44.5 % (ref 36.0–46.0)
HEMOGLOBIN: 15.3 g/dL — AB (ref 12.0–15.0)
Lymphocytes Relative: 19 % (ref 12–46)
Lymphs Abs: 2.8 10*3/uL (ref 0.7–4.0)
MCH: 30.5 pg (ref 26.0–34.0)
MCHC: 34.4 g/dL (ref 30.0–36.0)
MCV: 88.8 fL (ref 78.0–100.0)
MONO ABS: 0.7 10*3/uL (ref 0.1–1.0)
MPV: 10.6 fL (ref 8.6–12.4)
Monocytes Relative: 5 % (ref 3–12)
NEUTROS ABS: 11.3 10*3/uL — AB (ref 1.7–7.7)
Neutrophils Relative %: 76 % (ref 43–77)
PLATELETS: 236 10*3/uL (ref 150–400)
RBC: 5.01 MIL/uL (ref 3.87–5.11)
RDW: 14.8 % (ref 11.5–15.5)
WBC: 14.9 10*3/uL — AB (ref 4.0–10.5)

## 2014-11-02 LAB — COMPLETE METABOLIC PANEL WITH GFR
ALK PHOS: 41 U/L (ref 33–115)
ALT: 34 U/L — AB (ref 6–29)
AST: 26 U/L (ref 10–35)
Albumin: 4.3 g/dL (ref 3.6–5.1)
BUN: 10 mg/dL (ref 7–25)
CALCIUM: 9.7 mg/dL (ref 8.6–10.2)
CHLORIDE: 107 mmol/L (ref 98–110)
CO2: 26 mmol/L (ref 20–31)
CREATININE: 0.69 mg/dL (ref 0.50–1.10)
GFR, Est Non African American: >89 mL/min (ref >=60)
Glucose, Bld: 88 mg/dL (ref 65–99)
Potassium: 4.2 mmol/L (ref 3.5–5.3)
Sodium: 140 mmol/L (ref 135–146)
Total Bilirubin: 0.3 mg/dL (ref 0.2–1.2)
Total Protein: 7.1 g/dL (ref 6.1–8.1)

## 2014-11-02 LAB — TSH: TSH: 1.957 u[IU]/mL (ref 0.350–4.500)

## 2014-11-02 NOTE — Telephone Encounter (Signed)
Pt's husband called and states pt did stop the neltrexone and the diarrhea has stopped. She still has a body aches and her body feels like its on fire. He wants to know what she can take for the body aches

## 2014-11-03 ENCOUNTER — Encounter: Payer: Self-pay | Admitting: Family Medicine

## 2014-11-03 LAB — C. DIFFICILE GDH AND TOXIN A/B

## 2014-11-03 MED ORDER — DICLOFENAC SODIUM 50 MG PO TBEC: DELAYED_RELEASE_TABLET | ORAL | Status: DC

## 2014-11-03 NOTE — Telephone Encounter (Signed)
Christy Shaffer, Will you please patient know that I still think she should go to the ER.  If she's unwilling I'm ok with her coming in for a nurse visit to collect a urine sample for a POC UA and then sending it for a culture.  She needs to be aware that the culture results will not be available until sometime next week but if her UA is suggestive of a UTI we could prescribe an antibiotic that day.  Also, she needs to know that our lab is refusing to run my requested test because of some protocol they've implemented where they only test diarrhea.  This protocol is considered absurd by myself and many of my colleagues here in the clinic.  If she has any diarrhea we can try submitting the test again.    Again, I still feel the ED is in her best interest since it would expedite care however I don't want her to feel abandoned which is why I'm offering the nurse visit "plan B".

## 2014-11-03 NOTE — Telephone Encounter (Addendum)
Sue Lush, Rx of diclofenac sent to [facility] pharmacy.  Can you ask the patient/husband if her stool sample has been dropped off yet and if so can you confirm that [facility] is processing this since I don't see results yet. (C.Diff test).  If this stool test is normal then I suspect her symptoms must be due to naloxone based on the timing of her symptoms and when she started and stopped this medication.  I'm having trouble contacting her Neurologist and would recommend that she try to follow up with him since I have no experience with prescribing Naloxone.

## 2014-11-03 NOTE — Telephone Encounter (Signed)
Spoke with pt and she states she feels horrible and now feels she has a UTI. She states she cannot move or do anything. All she can do is lay in bed. I did speak with dr. Ivan Anchors and he advised to go to the ER. I advised pt that she should go to the ER. She states that she cannot go because she cannot afford it.I told her at this point it would still take days if we did lab work and with the way she is feeling it would be best to go ahead and go to the ER. Pt asked what if she brought in another stool sample. Again I said it would still take days before we got the results back from this test. I asked her again to try and f/u with Neurologist. Pt states she will try but she does not want to go to the ER because she doesn't want to pay the copay

## 2014-11-04 NOTE — Telephone Encounter (Signed)
Called pt and and had to leave a message on her voicemail

## 2014-11-07 NOTE — Telephone Encounter (Signed)
Called pt to f/u and see if she went to ER and how she was doing. Had to leave another message on her vm. Left message to please call us back with update

## 2014-11-08 ENCOUNTER — Encounter: Payer: Self-pay | Admitting: Family Medicine

## 2014-11-08 DIAGNOSIS — D72829 Elevated white blood cell count, unspecified: Secondary | ICD-10-CM

## 2014-11-09 ENCOUNTER — Telehealth: Payer: Self-pay | Admitting: Family Medicine

## 2014-11-09 NOTE — Telephone Encounter (Signed)
Christy Shaffer, Will you please call patient and let her know that I've printed off some labs and urine studies to try to figure out what's causing her fatigue and burning sensation.

## 2014-11-09 NOTE — Telephone Encounter (Signed)
Left message on vm and faxed labs

## 2014-12-27 ENCOUNTER — Encounter: Payer: Self-pay | Admitting: Family Medicine

## 2014-12-27 ENCOUNTER — Ambulatory Visit (INDEPENDENT_AMBULATORY_CARE_PROVIDER_SITE_OTHER): Payer: BLUE CROSS/BLUE SHIELD | Admitting: Family Medicine

## 2014-12-27 VITALS — BP 127/86 | HR 99 | Wt 150.0 lb

## 2014-12-27 DIAGNOSIS — R112 Nausea with vomiting, unspecified: Secondary | ICD-10-CM

## 2014-12-27 DIAGNOSIS — K219 Gastro-esophageal reflux disease without esophagitis: Secondary | ICD-10-CM

## 2014-12-27 MED ORDER — ONDANSETRON HCL 4 MG PO TABS: 4.0000 mg | ORAL_TABLET | Freq: Three times a day (TID) | ORAL | Status: AC | PRN

## 2014-12-27 MED ORDER — PHENOBARBITAL 20 MG/5ML PO ELIX: ORAL_SOLUTION | ORAL | Status: DC

## 2014-12-27 MED ORDER — PANTOPRAZOLE SODIUM 40 MG PO TBEC: 40.0000 mg | DELAYED_RELEASE_TABLET | Freq: Every day | ORAL | Status: DC

## 2014-12-27 NOTE — Progress Notes (Signed)
CC: Christy Shaffer is a 44 y.o. female is here for Medication Management   Subjective: HPI:  Complains of nausea that occurs whenever she takes oxycodone prescribed by her pain management specialist. She also gets nauseous just thinking about taking the medication. Symptoms are bad enough to where the Common for Her to Vomit. Symptoms Are Improved with Avoiding Thinking about Oxycodone. Nothing else thinks symptoms better or worse. They're moderate in severity most days of the week. She denies any abdominal pain, loss of appetite, constipation, nor diarrhea.  GERD: She's been taking over-the-counter proton pump inhibitors but it has not helped any of her reflux. She describes a liquid burning sensation in the epigastric region that radiates up behind the sternum. No exertional component to her pain. Symptoms are improved with taking phenobarbital when she goes to bed however causes her to fall asleep so she cannot take it during the day.  Review Of Systems Outlined In HPI  Past Medical History  Diagnosis Date  . Factor V Leiden mutation ([facility])     trait  . Varicosities   . Atypical facial pain 2001    V2 distribution after dental procedures  . Fibromyalgia     Lyme titers neg, extensive w/u  . GERD (gastroesophageal reflux disease)   . History of chicken pox   . Migraine with aura   . History of shingles   . Fibromyalgia   . Fibromyalgia muscle pain   . Depression   . Trigeminal neuralgia pain     Past Surgical History  Procedure Laterality Date  . Endometrial ablation  01/14/08  . Hysteroscopy  01/14/08  . Total abdominal hysterectomy  05/10/09    with sling, fibroids and heavy bleeding  . Appendectomy    . Tonsillectomy  1983   Family History  Problem Relation Age of Onset  . Alcohol abuse Mother   . Arthritis Mother   . Hypertension Mother   . Hyperlipidemia Mother   . Diabetes Mother   . Arthritis Maternal Grandmother   . Stroke Maternal Grandmother   . Diabetes  Maternal Grandmother   . Diabetes Maternal Grandfather   . Coronary artery disease Neg Hx   . Cancer Neg Hx     Social History   Social History  . Marital Status: Married    Spouse Name: N/A  . Number of Children: N/A  . Years of Education: N/A   Occupational History  . Not on file.   Social History Main Topics  . Smoking status: Former Smoker -- 0.50 packs/day for 10 years    Types: Cigarettes    Quit date: 02/26/1999  . Smokeless tobacco: Never Used  . Alcohol Use: No  . Drug Use: No  . Sexual Activity: Yes    Birth Control/ Protection: Other-see comments     Comment: ablation   Other Topics Concern  . Not on file   Social History Narrative   Caffeine: none   Lives with daughters and husband, 1 dog   Occupation: none, used to be Sales executivedental assistant, prior medical foot care business      Other physicians ([facility])   Pain: Dr. Thomasena Edisollins/ PA Idolina PrimerUnderwood   Neurology: Dr.Liedtke   Rheum: Dr. Azzie GlatterFras     Objective: BP 127/86 mmHg  Pulse 99  Wt 150 lb (68.04 kg)  Vital signs reviewed. General: Alert and Oriented, No Acute Distress HEENT: Pupils equal, round, reactive to light. Conjunctivae clear.  External ears unremarkable.  Moist mucous membranes. Lungs: Clear and comfortable work  of breathing, speaking in full sentences without accessory muscle use. Cardiac: Regular rate and rhythm.  Neuro: CN II-XII grossly intact, gait normal. Extremities: No peripheral edema.  Strong peripheral pulses.  Mental Status: No depression, anxiety, nor agitation. Logical though process. Skin: Warm and dry.  Assessment & Plan: [name] was seen today for medication management.  Diagnoses and all orders for this visit:  Nausea and vomiting, intractability of vomiting not specified, unspecified vomiting type -     ondansetron (ZOFRAN) 4 MG tablet; Take 1-2 tablets (4-8 mg total) by mouth every 8 (eight) hours as needed for nausea or vomiting.  Gastroesophageal reflux disease, esophagitis  presence not specified  Other orders -     pantoprazole (PROTONIX) 40 MG tablet; Take 1 tablet (40 mg total) by mouth daily. -     PHENObarbital 20 MG/5ML elixir; One teaspoon every evening as needed for upset stomach.   Nausea: Start O dancy trauma at every dosing of oxycodone in hopes of reconditioning her to no longer become nauseous with even the thought of oxycodone.  GERD: Uncontrolled chronic condition start Protonix, reasonable to continue as needed phenobarbital dose at night.  Return in about 3 months (around 03/29/2015).

## 2014-12-27 NOTE — Patient Instructions (Signed)
Biotin supplement daily (found near B Vitamin section at any pharmacy).

## 2015-04-19 ENCOUNTER — Encounter: Payer: Self-pay | Admitting: Family Medicine

## 2015-04-19 ENCOUNTER — Ambulatory Visit (INDEPENDENT_AMBULATORY_CARE_PROVIDER_SITE_OTHER): Payer: BLUE CROSS/BLUE SHIELD | Admitting: Family Medicine

## 2015-04-19 VITALS — BP 132/84 | HR 83 | Wt 160.0 lb

## 2015-04-19 DIAGNOSIS — H6123 Impacted cerumen, bilateral: Secondary | ICD-10-CM

## 2015-04-19 DIAGNOSIS — Z23 Encounter for immunization: Secondary | ICD-10-CM | POA: Diagnosis not present

## 2015-04-19 DIAGNOSIS — R635 Abnormal weight gain: Secondary | ICD-10-CM | POA: Diagnosis not present

## 2015-04-19 DIAGNOSIS — Z780 Asymptomatic menopausal state: Secondary | ICD-10-CM

## 2015-04-19 DIAGNOSIS — G43809 Other migraine, not intractable, without status migrainosus: Secondary | ICD-10-CM

## 2015-04-19 MED ORDER — TRETINOIN 0.025 % EX CREA: TOPICAL_CREAM | Freq: Every day | CUTANEOUS | Status: AC

## 2015-04-19 MED ORDER — PANTOPRAZOLE SODIUM 40 MG PO TBEC: 40.0000 mg | DELAYED_RELEASE_TABLET | Freq: Two times a day (BID) | ORAL | Status: DC

## 2015-04-19 MED ORDER — TRETINOIN 0.025 % EX CREA: TOPICAL_CREAM | Freq: Every day | CUTANEOUS | Status: DC

## 2015-04-19 MED ORDER — METHYLPREDNISOLONE SODIUM SUCC 125 MG IJ SOLR
250.0000 mg | Freq: Once | INTRAMUSCULAR | Status: AC
  Administered 2015-04-19: 250 mg via INTRAVENOUS

## 2015-04-19 MED ORDER — PHENTERMINE HCL 37.5 MG PO TABS: 37.5000 mg | ORAL_TABLET | Freq: Every day | ORAL | Status: AC

## 2015-04-19 NOTE — Progress Notes (Signed)
CC: Christy Shaffer is a 45 y.o. female is here for Medication Management; Weight Gain; and Alopecia   Subjective: HPI:   ever since the summer she is noticed  Unintentional weight loss and difficulty with portion control. She will wake up in the middle of the night and eat nutella  Before falling back asleep.  Unable to break this habit despite trying her best. Denies edema.   she recently got Botox for treatment of trigeminal neuralgia however it's triggered a migraine on the left side of her head. Her neurologist has recommended that she get 3-5 days of 250 mg of methylprednisolone. She denies any other motor or sensory disturbances.   She has had some ear irritation bilaterally for the past 2-3 weeks. She's not able to get any wax out by herself. Other than Q-tips no other interventions. She denies hearing loss or fevers nor dizziness.  Review Of Systems Outlined In HPI  Past Medical History  Diagnosis Date  . Factor V Leiden mutation (HCC)     trait  . Varicosities   . Atypical facial pain 2001    V2 distribution after dental procedures  . Fibromyalgia     Lyme titers neg, extensive w/u  . GERD (gastroesophageal reflux disease)   . History of chicken pox   . Migraine with aura   . History of shingles   . Fibromyalgia   . Fibromyalgia muscle pain   . Depression   . Trigeminal neuralgia pain     Past Surgical History  Procedure Laterality Date  . Endometrial ablation  01/14/08  . Hysteroscopy  01/14/08  . Total abdominal hysterectomy  05/10/09    with sling, fibroids and heavy bleeding  . Appendectomy    . Tonsillectomy  1983   Family History  Problem Relation Age of Onset  . Alcohol abuse Mother   . Arthritis Mother   . Hypertension Mother   . Hyperlipidemia Mother   . Diabetes Mother   . Arthritis Maternal Grandmother   . Stroke Maternal Grandmother   . Diabetes Maternal Grandmother   . Diabetes Maternal Grandfather   . Coronary artery disease Neg Hx   . Cancer  Neg Hx     Social History   Social History  . Marital Status: Married    Spouse Name: N/A  . Number of Children: N/A  . Years of Education: N/A   Occupational History  . Not on file.   Social History Main Topics  . Smoking status: Former Smoker -- 0.50 packs/day for 10 years    Types: Cigarettes    Quit date: 02/26/1999  . Smokeless tobacco: Never Used  . Alcohol Use: No  . Drug Use: No  . Sexual Activity: Yes    Birth Control/ Protection: Other-see comments     Comment: ablation   Other Topics Concern  . Not on file   Social History Narrative   Caffeine: none   Lives with daughters and husband, 1 dog   Occupation: none, used to be Sales executive, prior medical foot care business      Other physicians ([facility])   Pain: Dr. Thomasena Edis PA Idolina Primer   Neurology: Dr.Liedtke   Rheum: Dr. Azzie Glatter     Objective: BP 132/84 mmHg  Pulse 83  Wt 160 lb (72.576 kg)  General: Alert and Oriented, No Acute Distress HEENT: Pupils equal, round, reactive to light. Conjunctivae clear.   Bilateral  Cerumen impactions otherwise unremarkable external canals and ear Lungs: Clear to auscultation bilaterally, no wheezing/ronchi/rales.  Comfortable work of breathing. Good air movement. Cardiac: Regular rate and rhythm. Normal S1/S2.  No murmurs, rubs, nor gallops.   Extremities: No peripheral edema.  Strong peripheral pulses.  Mental Status: No depression, anxiety, nor agitation. Skin: Warm and dry.  Assessment & Plan: Alethia was seen today for medication management, weight gain and alopecia.  Diagnoses and all orders for this visit:  Menopause  Cerumen impaction, bilateral  Other migraine without status migrainosus, not intractable  Abnormal weight gain  Other orders -     phentermine (ADIPEX-P) 37.5 MG tablet; Take 1 tablet (37.5 mg total) by mouth daily before breakfast. -     tretinoin (RETIN-A) 0.025 % cream; Apply topically at bedtime. -     pantoprazole (PROTONIX) 40 MG  tablet; Take 1 tablet (40 mg total) by mouth 2 (two) times daily.    unintentional weight gain: Encourage her to stop midnight snacking and she can try phentermine. She'll be leaving for [place] in the middle March and have invited her to follow-up with a nurse visit prior to her departure even if it's  Prior to 4 weeks of phentermine, I be happy to provide her with a refill  Prior to leaving for [place] if she is losing weight.  migraine: 250 mg of methylprednisolone today,  She'll be out of town starting tomorrow but she returns  Next week I invited her to have a nurse visit  For 2 additional doses  On sequential days,  Also she needs her ears cleaned out at  One of these visits.  Protonix has made huge improvement with her GERD, refills provided.  25 minutes spent face-to-face during visit today of which at least 50% was counseling or coordinating care regarding: 1. Menopause   2. Cerumen impaction, bilateral   3. Other migraine without status migrainosus, not intractable   4. Abnormal weight gain      Return in about 4 weeks (around 05/17/2015) for BP and Weight Check while taking phentermine.Marland Kitchen

## 2015-04-19 NOTE — Addendum Note (Signed)
Addended by: Pixie Casino on: 04/19/2015 01:22 PM   Modules accepted: Orders

## 2015-04-19 NOTE — Addendum Note (Signed)
Addended by: Thom Chimes on: 04/19/2015 01:37 PM   Modules accepted: Orders

## 2015-04-19 NOTE — Addendum Note (Signed)
Addended by: Pixie Casino on: 04/19/2015 12:03 PM   Modules accepted: Orders

## 2015-04-19 NOTE — Patient Instructions (Signed)
If you need a second and third injection of methylprednisolone  IM please schedule a nurse visit for this when you get back from [place].  If your ears are still bothering you make sure you ask for your ears to be irrigated when scheduling these nurse visits.

## 2015-04-27 ENCOUNTER — Telehealth: Payer: Self-pay | Admitting: Family Medicine

## 2015-04-27 MED ORDER — VALACYCLOVIR HCL 1 G PO TABS: ORAL_TABLET | ORAL | Status: AC

## 2015-04-27 NOTE — Telephone Encounter (Signed)
Please recommend to patient that she start Valtrex that i sent to her pharmacy ASAP.

## 2015-04-27 NOTE — Telephone Encounter (Signed)
Pt contacted clinic today stating she has the shingles for the 4th time. Pt reports they are on her Right breast, which is where they have come up every time. Pt states she tried to contact her Neurologist (Dr. Clint Lipps) to ask for an Rx, and they have not contacted her back. Pt has an appt with PCP tomorrow but wants to know if she should be started on Rx's before then. Pt uses [facility] in [place].

## 2015-04-27 NOTE — Telephone Encounter (Signed)
Notified patient that script was sent to the pharmacy.

## 2015-04-28 ENCOUNTER — Encounter: Payer: Self-pay | Admitting: Family Medicine

## 2015-04-28 ENCOUNTER — Ambulatory Visit (INDEPENDENT_AMBULATORY_CARE_PROVIDER_SITE_OTHER): Payer: BLUE CROSS/BLUE SHIELD | Admitting: Family Medicine

## 2015-04-28 VITALS — BP 128/92 | HR 109 | Wt 154.0 lb

## 2015-04-28 DIAGNOSIS — B029 Zoster without complications: Secondary | ICD-10-CM

## 2015-04-28 DIAGNOSIS — G43809 Other migraine, not intractable, without status migrainosus: Secondary | ICD-10-CM

## 2015-04-28 DIAGNOSIS — K219 Gastro-esophageal reflux disease without esophagitis: Secondary | ICD-10-CM | POA: Diagnosis not present

## 2015-04-28 MED ORDER — ACYCLOVIR 5 % EX OINT: 1.0000 "application " | TOPICAL_OINTMENT | CUTANEOUS | Status: AC

## 2015-04-28 MED ORDER — PANTOPRAZOLE SODIUM 40 MG PO TBEC: 40.0000 mg | DELAYED_RELEASE_TABLET | Freq: Two times a day (BID) | ORAL | Status: AC

## 2015-04-28 MED ORDER — METHYLPREDNISOLONE SODIUM SUCC 125 MG IJ SOLR: 250.0000 mg | Freq: Once | INTRAMUSCULAR | Status: DC

## 2015-04-28 NOTE — Addendum Note (Signed)
Addended by: Thom ChimesHENRY, Jerzi Tigert M on: 04/28/2015 09:35 AM   Modules accepted: Orders

## 2015-04-28 NOTE — Progress Notes (Signed)
CC: Christy Shaffer is a 45 y.o. female is here for Herpes Zoster; Migraine; and Ear Fullness   Subjective: HPI:  Rash on the right upper That erupted 36 hours ago. It's painful. She started Valtrex yesterday. She tells me feels like shingles and is in a location that shingles has erupted 5 times before in the past. She is tolerating Valtrex and is requesting a refill on topical acyclovir that helps Reduce the duration of shingles on bricks in the past. She denies fevers, chills, nor night sweats. She denies rashes elsewhere.  She is requesting the second of 3 injections recommended by her neurologist after she began having a migraine after Botox injections last week. She denies any new characteristics to her headache nor neck pain.  She needs a refill of Protonix sent to [facility], her pharmacy will not fill this long-term.    Review Of Systems Outlined In HPI  Past Medical History  Diagnosis Date  . Factor V Leiden mutation (HCC)     trait  . Varicosities   . Atypical facial pain 2001    V2 distribution after dental procedures  . Fibromyalgia     Lyme titers neg, extensive w/u  . GERD (gastroesophageal reflux disease)   . History of chicken pox   . Migraine with aura   . History of shingles   . Fibromyalgia   . Fibromyalgia muscle pain   . Depression   . Trigeminal neuralgia pain     Past Surgical History  Procedure Laterality Date  . Endometrial ablation  01/14/08  . Hysteroscopy  01/14/08  . Total abdominal hysterectomy  05/10/09    with sling, fibroids and heavy bleeding  . Appendectomy    . Tonsillectomy  1983   Family History  Problem Relation Age of Onset  . Alcohol abuse Mother   . Arthritis Mother   . Hypertension Mother   . Hyperlipidemia Mother   . Diabetes Mother   . Arthritis Maternal Grandmother   . Stroke Maternal Grandmother   . Diabetes Maternal Grandmother   . Diabetes Maternal Grandfather   . Coronary artery disease Neg Hx   . Cancer Neg Hx     Social History   Social History  . Marital Status: Married    Spouse Name: N/A  . Number of Children: N/A  . Years of Education: N/A   Occupational History  . Not on file.   Social History Main Topics  . Smoking status: Former Smoker -- 0.50 packs/day for 10 years    Types: Cigarettes    Quit date: 02/26/1999  . Smokeless tobacco: Never Used  . Alcohol Use: No  . Drug Use: No  . Sexual Activity: Yes    Birth Control/ Protection: Other-see comments     Comment: ablation   Other Topics Concern  . Not on file   Social History Narrative   Caffeine: none   Lives with daughters and husband, 1 dog   Occupation: none, used to be Sales executivedental assistant, prior medical foot care business      Other physicians ([facility])   Pain: Dr. Thomasena Edisollins/ PA Idolina PrimerUnderwood   Neurology: Dr.Liedtke   Rheum: Dr. Azzie GlatterFras     Objective: BP 128/92 mmHg  Pulse 109  Wt 154 lb (69.854 kg)  Vital signs reviewed. General: Alert and Oriented, No Acute Distress HEENT: Pupils equal, round, reactive to light. Conjunctivae clear.  External ears unremarkable.  Moist mucous membranes. Lungs: Clear and comfortable work of breathing, speaking in full sentences without  accessory muscle use. Cardiac: Regular rate and rhythm.  Neuro: CN II-XII grossly intact, gait normal. Extremities: No peripheral edema.  Strong peripheral pulses.  Mental Status: No depression, anxiety, nor agitation. Logical though process. Skin: Warm and dry. Approximately 10 erythematous vesicles on the right upper chest just medial to the axilla. No signs of bacterial infection  Assessment & Plan: Christy Shaffer was seen today for herpes zoster, migraine and ear fullness.  Diagnoses and all orders for this visit:  Gastroesophageal reflux disease without esophagitis -     pantoprazole (PROTONIX) 40 MG tablet; Take 1 tablet (40 mg total) by mouth 2 (two) times daily.  Other migraine without status migrainosus, not intractable  Shingles  Other orders -      acyclovir ointment (ZOVIRAX) 5 %; Apply 1 application topically every 3 (three) hours. Apply until rash is gone.   GERD: Control with long-term Protonix refills provided Chronic migraines: Solu-Medrol today for neurologist recommendations. She can return for 1 more injection if needed. Shingles: Already started on Valtrex and acyclovir refill seems reasonable.  Return if symptoms worsen or fail to improve.

## 2015-05-02 ENCOUNTER — Telehealth: Payer: Self-pay | Admitting: *Deleted

## 2015-05-02 NOTE — Telephone Encounter (Signed)
Prior Berkley Harveyauth was obtained and approved for tretinoin. Patient's spouse and pharm notified

## 2015-05-03 ENCOUNTER — Telehealth: Payer: Self-pay | Admitting: Family Medicine

## 2015-05-03 NOTE — Telephone Encounter (Signed)
Received fax for prior authorization on Trentinoin sent through cover my meds waiting on authorization. - CF

## 2015-05-03 NOTE — Telephone Encounter (Signed)
Received fax from [facility] and medication is approved from 05/03/2015 - 05/03/2018 PA # [id number]

## 2015-08-07 ENCOUNTER — Encounter: Payer: Self-pay | Admitting: Family Medicine

## 2015-08-07 ENCOUNTER — Other Ambulatory Visit: Payer: Self-pay | Admitting: Physician Assistant

## 2015-08-07 ENCOUNTER — Other Ambulatory Visit: Payer: Self-pay | Admitting: Family Medicine

## 2015-08-07 MED ORDER — PHENOBARBITAL 20 MG/5ML PO ELIX: ORAL_SOLUTION | ORAL | Status: AC

## 2016-01-10 ENCOUNTER — Ambulatory Visit: Payer: Self-pay | Admitting: Physician Assistant
